# Patient Record
Sex: Female | Born: 1980 | Race: White | Hispanic: No | Marital: Single | Smoking: Former smoker
Health system: Southern US, Community
[De-identification: ages and names within clinical notes are randomized; demographics above are authoritative.]

## PROBLEM LIST (undated history)

## (undated) DIAGNOSIS — F419 Anxiety disorder, unspecified: Secondary | ICD-10-CM

## (undated) DIAGNOSIS — J45990 Exercise induced bronchospasm: Secondary | ICD-10-CM

## (undated) DIAGNOSIS — G479 Sleep disorder, unspecified: Secondary | ICD-10-CM

## (undated) DIAGNOSIS — J45909 Unspecified asthma, uncomplicated: Secondary | ICD-10-CM

## (undated) DIAGNOSIS — F329 Major depressive disorder, single episode, unspecified: Secondary | ICD-10-CM

## (undated) DIAGNOSIS — F32A Depression, unspecified: Secondary | ICD-10-CM

## (undated) DIAGNOSIS — G43909 Migraine, unspecified, not intractable, without status migrainosus: Secondary | ICD-10-CM

## (undated) DIAGNOSIS — K219 Gastro-esophageal reflux disease without esophagitis: Secondary | ICD-10-CM

## (undated) DIAGNOSIS — K2970 Gastritis, unspecified, without bleeding: Secondary | ICD-10-CM

## (undated) HISTORY — PX: OTHER SURGICAL HISTORY: SHX169

## (undated) HISTORY — DX: Sleep disorder, unspecified: G47.9

## (undated) HISTORY — DX: Depression, unspecified: F32.A

## (undated) HISTORY — DX: Migraine, unspecified, not intractable, without status migrainosus: G43.909

## (undated) HISTORY — DX: Exercise induced bronchospasm: J45.990

## (undated) HISTORY — DX: Gastritis, unspecified, without bleeding: K29.70

## (undated) HISTORY — DX: Major depressive disorder, single episode, unspecified: F32.9

## (undated) HISTORY — DX: Anxiety disorder, unspecified: F41.9

---
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
## 2010-11-27 ENCOUNTER — Ambulatory Visit: Payer: Self-pay | Admitting: General Practice

## 2012-01-05 ENCOUNTER — Emergency Department: Payer: Self-pay | Admitting: Emergency Medicine

## 2012-01-05 LAB — URINALYSIS, COMPLETE
Ketone: NEGATIVE
Nitrite: NEGATIVE
Protein: NEGATIVE
Specific Gravity: 1.023 (ref 1.003–1.030)

## 2013-02-24 ENCOUNTER — Ambulatory Visit: Payer: Self-pay | Admitting: Obstetrics and Gynecology

## 2013-03-22 ENCOUNTER — Ambulatory Visit: Payer: Self-pay | Admitting: Obstetrics and Gynecology

## 2013-04-15 ENCOUNTER — Inpatient Hospital Stay: Payer: Self-pay

## 2013-04-15 LAB — CBC WITH DIFFERENTIAL/PLATELET
BASOS ABS: 0 10*3/uL (ref 0.0–0.1)
BASOS PCT: 0.4 %
EOS ABS: 0.1 10*3/uL (ref 0.0–0.7)
Eosinophil %: 0.7 %
HCT: 30.6 % — AB (ref 35.0–47.0)
HGB: 9.9 g/dL — AB (ref 12.0–16.0)
Lymphocyte #: 1.2 10*3/uL (ref 1.0–3.6)
Lymphocyte %: 15.6 %
MCH: 26.4 pg (ref 26.0–34.0)
MCHC: 32.4 g/dL (ref 32.0–36.0)
MCV: 82 fL (ref 80–100)
Monocyte #: 0.6 x10 3/mm (ref 0.2–0.9)
Monocyte %: 7.2 %
NEUTROS ABS: 5.9 10*3/uL (ref 1.4–6.5)
Neutrophil %: 76.1 %
PLATELETS: 134 10*3/uL — AB (ref 150–440)
RBC: 3.75 10*6/uL — ABNORMAL LOW (ref 3.80–5.20)
RDW: 15.4 % — AB (ref 11.5–14.5)
WBC: 7.8 10*3/uL (ref 3.6–11.0)

## 2013-04-16 LAB — PLATELET COUNT: Platelet: 131 10*3/uL — ABNORMAL LOW (ref 150–440)

## 2013-04-17 LAB — HEMATOCRIT: HCT: 27 % — ABNORMAL LOW (ref 35.0–47.0)

## 2013-04-22 ENCOUNTER — Ambulatory Visit: Payer: Self-pay | Admitting: Obstetrics and Gynecology

## 2013-07-26 IMAGING — CR DG ABDOMEN 1V
1 series · 2 of 2 positions shown · non-contrast
Comparison: none

REASON FOR EXAM: abd pain, decrease BS, no BM
COMMENTS:   May transport without cardiac monitor

PROCEDURE:     DXR - DXR KIDNEY URETER BLADDER  - January 05, 2012  [DATE]
RESULT:     Bowel gas pattern is unremarkable. Air and fecal material is
scattered through the colon to the rectum. There is no obstruction. There is
no mass effect. Phleboliths are present in the right pelvis.

[Series 1: supine kub · 0.17mm/px · 2 of 2 slices shown]
[im 1/2]
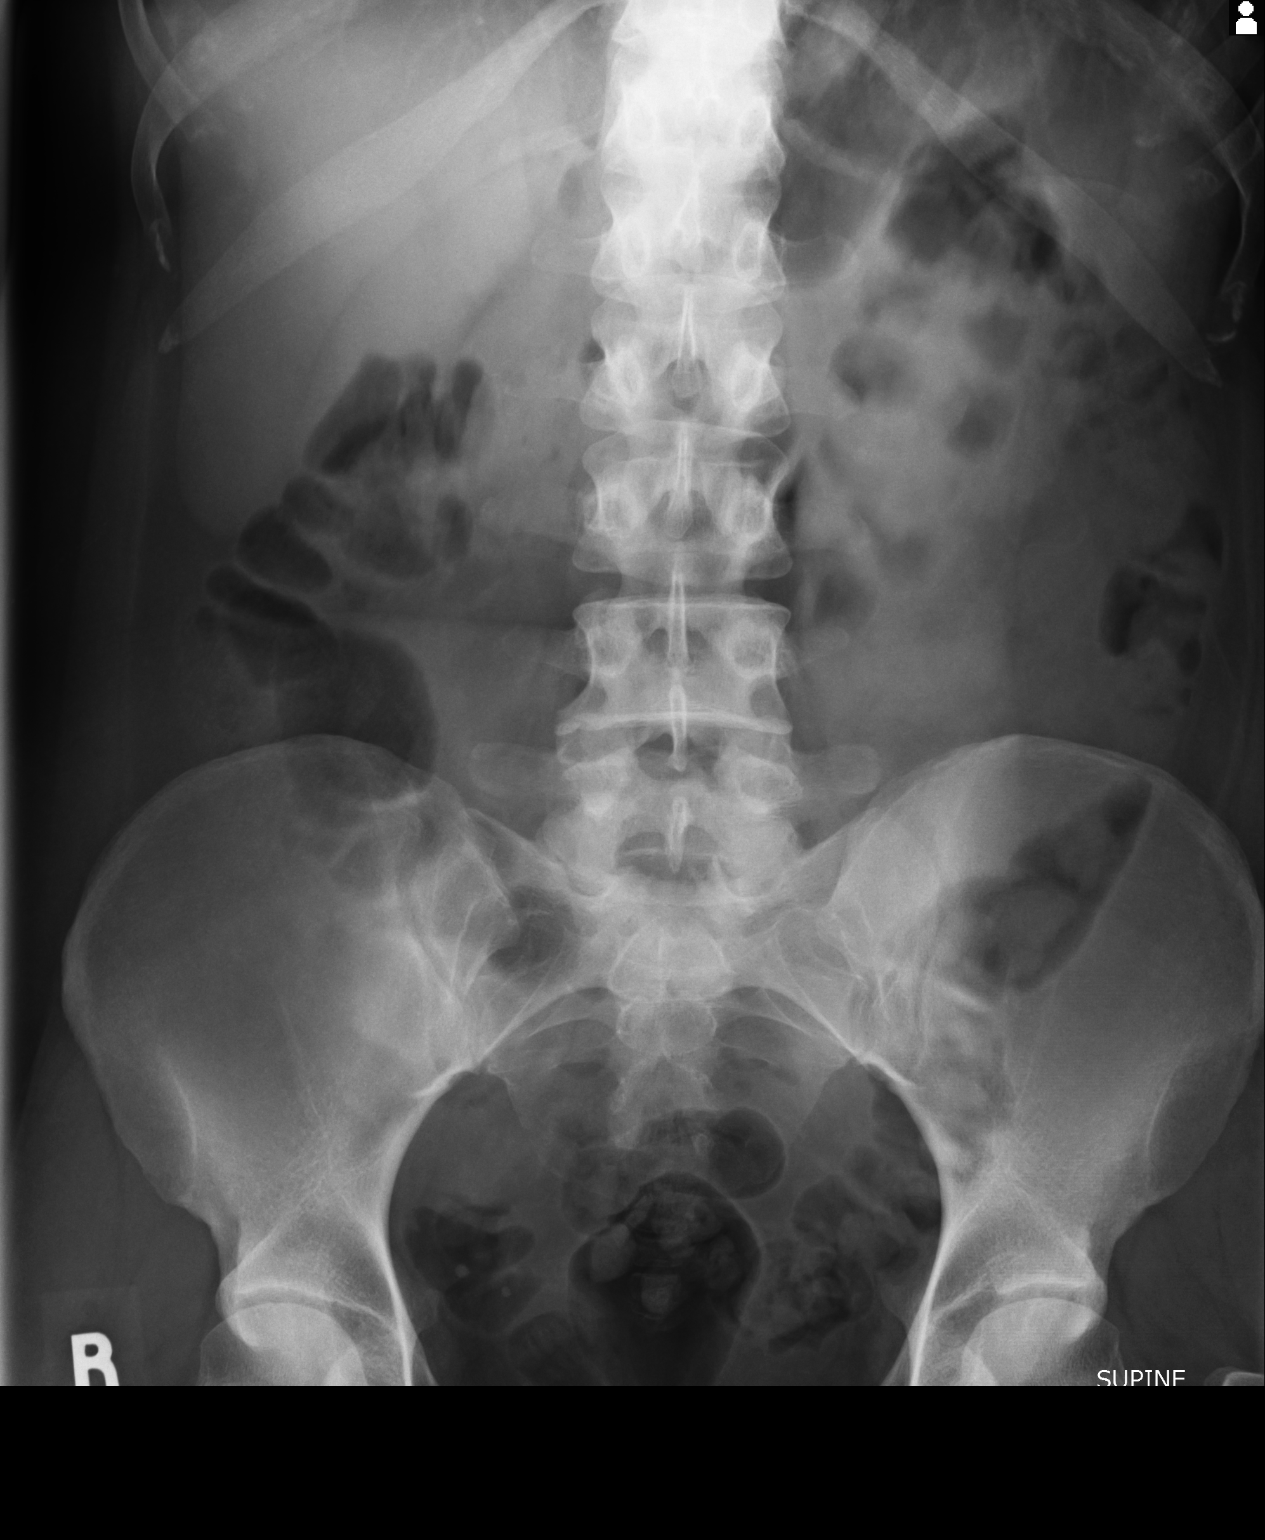
[im 2/2]
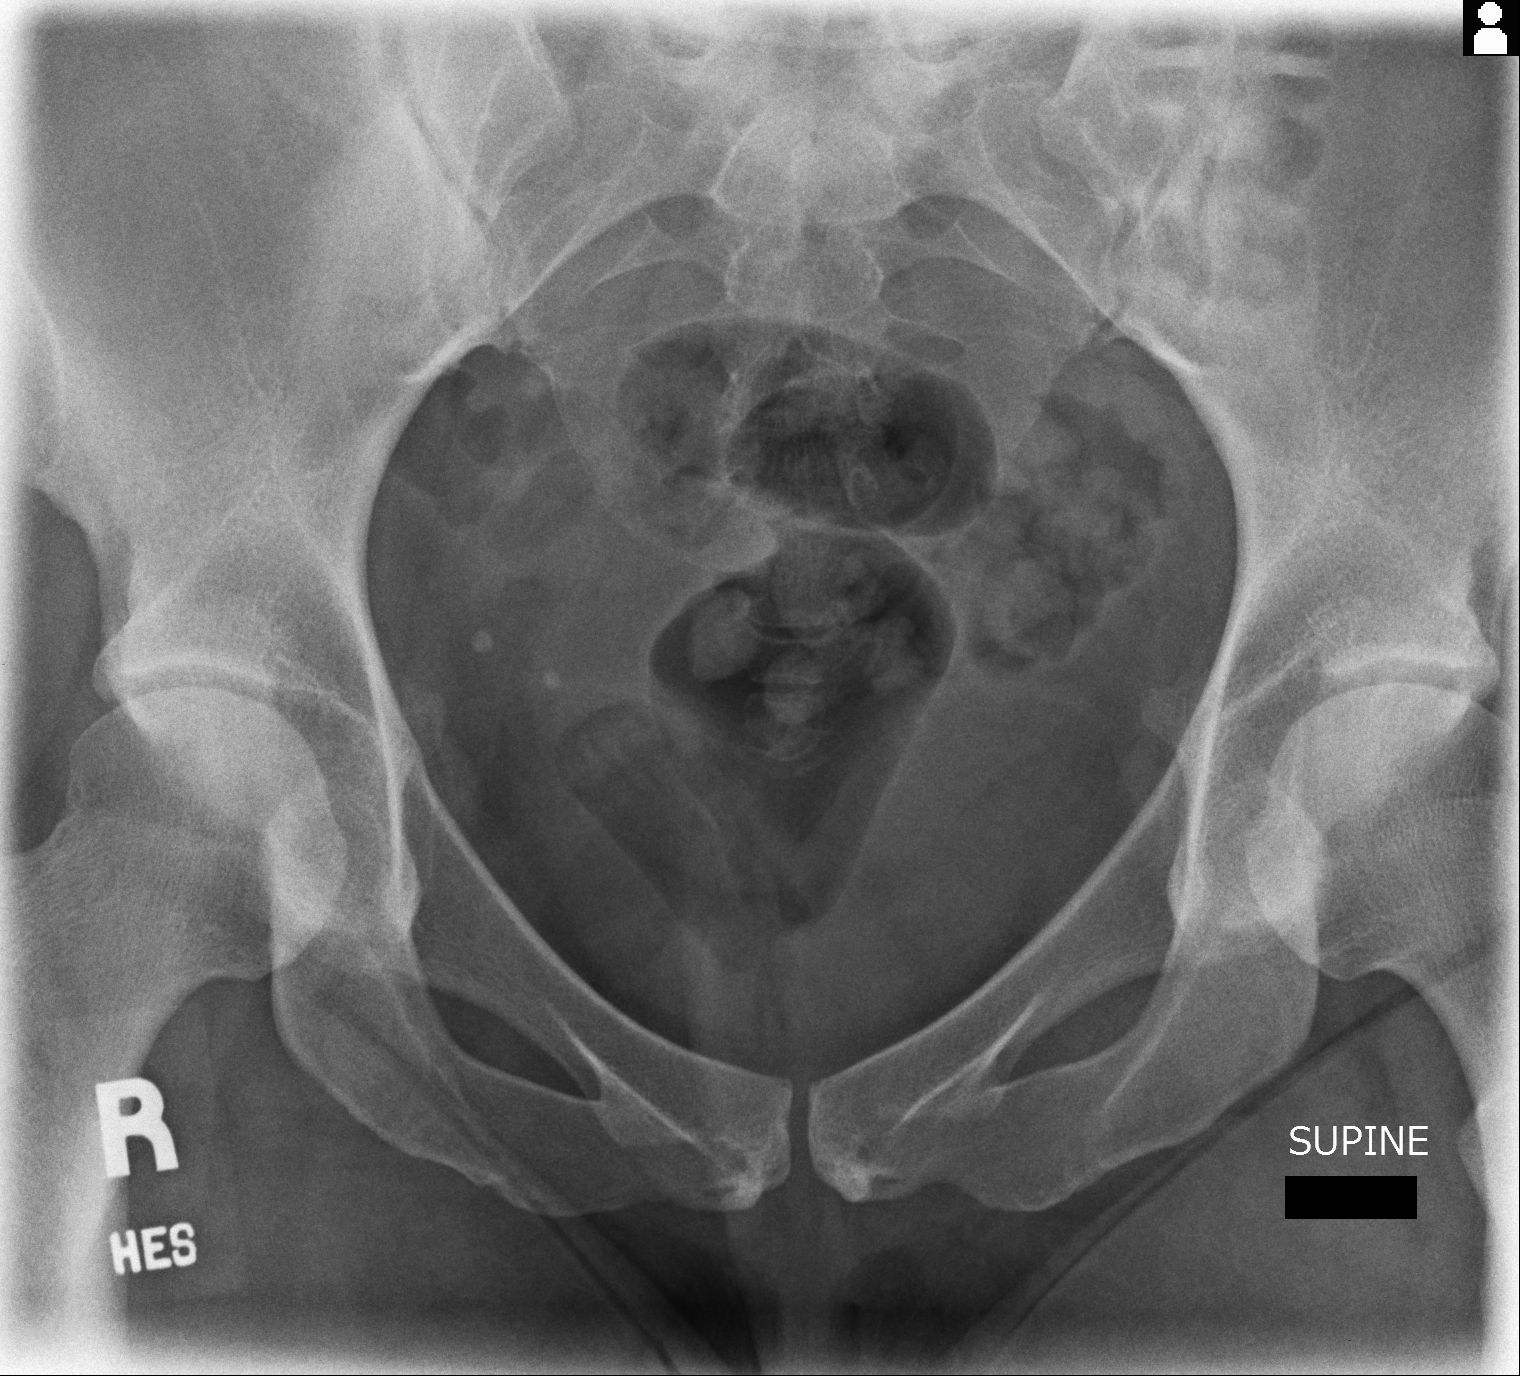

[2 of 2 positions shown; findings below may reference images not displayed]

IMPRESSION: No acute abnormality evident.

[REDACTED]

## 2014-04-01 ENCOUNTER — Ambulatory Visit (INDEPENDENT_AMBULATORY_CARE_PROVIDER_SITE_OTHER): Payer: BLUE CROSS/BLUE SHIELD | Admitting: General Surgery

## 2014-04-01 ENCOUNTER — Encounter: Payer: Self-pay | Admitting: General Surgery

## 2014-04-01 ENCOUNTER — Encounter: Payer: Self-pay | Admitting: *Deleted

## 2014-04-01 VITALS — BP 116/62 | HR 74 | Temp 97.9°F | Resp 12 | Ht 70.0 in | Wt 245.0 lb

## 2014-04-01 DIAGNOSIS — K603 Anal fistula: Secondary | ICD-10-CM | POA: Diagnosis not present

## 2014-04-01 DIAGNOSIS — K6289 Other specified diseases of anus and rectum: Secondary | ICD-10-CM | POA: Diagnosis not present

## 2014-04-01 LAB — POC HEMOCCULT BLD/STL (OFFICE/1-CARD/DIAGNOSTIC): Fecal Occult Blood, POC: NEGATIVE

## 2014-04-01 NOTE — Patient Instructions (Signed)
Patient to return ion two weeks.

## 2014-04-01 NOTE — Progress Notes (Signed)
[name], female   DOB: 07/11/1980, 34 y.o.   MRN: [id number]  Chief Complaint  Patient presents with  . Other    rectal abscess    HPI Veronica Reynolds is a 34 y.o. female here today for a evaluation of a rectal abscess. She states she noticed this about a week ago.The area is draining blood and she has noticed some clots of blood.  She is currently take Bactrim.  She states her fever has been around 100. She states she had a rectal abscess that was drained back in October 2015. HPI  Past Medical History  Diagnosis Date  . Depression     No past surgical history on file.  No family history on file.  Social History History  Substance Use Topics  . Smoking status: Former Smoker -- 1.00 packs/day    Types: Cigarettes  . Smokeless tobacco: Not on file  . Alcohol Use: No    Allergies  Allergen Reactions  . Codeine Nausea And Vomiting    Current Outpatient Prescriptions  Medication Sig Dispense Refill  . clonazePAM (KLONOPIN) 1 MG tablet Take 1 tablet by mouth as needed.    . sulfamethoxazole-trimethoprim (BACTRIM,SEPTRA) 400-80 MG per tablet Take 1 tablet by mouth 2 (two) times daily.  0   No current facility-administered medications for this visit.    Review of Systems Review of Systems  Constitutional: Positive for fever. Negative for chills, diaphoresis, activity change, appetite change, fatigue and unexpected weight change.  Respiratory: Negative.   Cardiovascular: Negative.     Blood pressure 116/62, pulse 74, temperature 97.9 F (36.6 C), resp. rate 12, height 5\' 10"  (1.778 m), weight 245 lb (111.131 kg).  Physical Exam Physical Exam  Constitutional: She is oriented to person, place, and time. She appears well-developed and well-nourished.  Eyes: Conjunctivae are normal. No scleral icterus.  Neck: Neck supple.  Cardiovascular: Normal rate and normal heart sounds.   Pulmonary/Chest: Effort normal and breath sounds normal.  Abdominal: Soft.  Bowel sounds are normal. There is no tenderness.  Genitourinary: Rectal exam shows anal tone normal. Guaiac negative stool.     Examination was completed in both stirrups and in the left lateral decubitus position. No abnormality of the vaginal mucosa was appreciated. The perineum was intact. No erythema, induration or tenderness.  Lymphadenopathy:    She has no cervical adenopathy.  Neurological: She is alert and oriented to person, place, and time.  Skin: Skin is dry.    Data Reviewed Office notes from [place], [place] dated 10/31/2013 were reviewed. This was the follow-up status post an earlier exam for an abscess. Normal anal rectal/vaginal exam reported.  Record of more recent exam not available at time of visit.  Anoscopy was completed: Normal visualized lower rectal mucosa. No internal or external hemorrhoids. Suspected small anterior fistula opening.  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    Plan    At this time, I think the likelihood of benefit from operative exploration is small. The patient will complete the prescribed course of Bactrim, SS, 1 by mouth twice a day over the next week. We'll plan for reassessment after that therapy is completed. We'll try to obtain the records from her earlier exams prior to abscess drainage and her more recent exam prompting today's referral.   Patient to return in two weeks     Ref. Melody Trudee Kuster  Robert Bellow 04/01/2014, 12:48 PM

## 2014-04-05 ENCOUNTER — Ambulatory Visit: Payer: Self-pay | Admitting: General Surgery

## 2014-04-07 ENCOUNTER — Emergency Department: Payer: Self-pay | Admitting: Internal Medicine

## 2014-04-12 DIAGNOSIS — K6289 Other specified diseases of anus and rectum: Secondary | ICD-10-CM | POA: Insufficient documentation

## 2014-04-15 ENCOUNTER — Ambulatory Visit: Payer: BLUE CROSS/BLUE SHIELD | Admitting: General Surgery

## 2014-06-01 NOTE — H&P (Signed)
L&D Evaluation:  History:  Presents with IOL at [redacted]w[redacted]d with GDM   Patient's Medical History other  anxiety disorder-   Patient's Surgical History none   Medications Pre Natal Vitamins  Tylenol (Acetaminophen)  Diclegis,zoloft(stopped x 4 weeks),   Allergies n/v with codiene   Social History none   Family History Non-Contributory   ROS:  ROS All systems were reviewed.  HEENT, CNS, GI, GU, Respiratory, CV, Renal and Musculoskeletal systems were found to be normal.   Exam:  Vital Signs stable   General no apparent distress   Mental Status clear  anxious   Chest clear   Heart normal sinus rhythm   Abdomen gravid, tender with contractions   Estimated Fetal Weight Average for gestational age   Fetal Position vtx   Edema 1+   Pelvic ft   Mebranes Intact   FHT normal rate with no decels   Fetal Heart Rate 135   Ucx regular, pitocin at 1 mu/min   Ucx Frequency 3 min   Length of each Contraction 60 seconds   Ucx Pain Scale 7   Skin dry   Impression:  Impression IOL   Plan:  Plan EFM/NST, monitor contractions and for cervical change   Electronic Signatures: Gennie Alma N (CNM)  (Signed 26-Mar-15 07:58)  Authored: L&D Evaluation   Last Updated: 26-Mar-15 07:58 by Evonnie Pat (CNM)

## 2014-08-04 ENCOUNTER — Other Ambulatory Visit: Payer: Self-pay | Admitting: Obstetrics and Gynecology

## 2014-08-04 DIAGNOSIS — K611 Rectal abscess: Secondary | ICD-10-CM

## 2014-09-24 ENCOUNTER — Encounter: Payer: Self-pay | Admitting: Obstetrics and Gynecology

## 2014-10-01 ENCOUNTER — Other Ambulatory Visit: Payer: Self-pay | Admitting: Obstetrics and Gynecology

## 2014-10-07 ENCOUNTER — Encounter: Payer: Self-pay | Admitting: *Deleted

## 2014-10-08 ENCOUNTER — Encounter: Payer: Self-pay | Admitting: Obstetrics and Gynecology

## 2014-10-08 ENCOUNTER — Ambulatory Visit (INDEPENDENT_AMBULATORY_CARE_PROVIDER_SITE_OTHER): Payer: BLUE CROSS/BLUE SHIELD | Admitting: Obstetrics and Gynecology

## 2014-10-08 VITALS — BP 120/72 | HR 81 | Ht 70.0 in | Wt 240.6 lb

## 2014-10-08 DIAGNOSIS — K625 Hemorrhage of anus and rectum: Secondary | ICD-10-CM

## 2014-10-08 DIAGNOSIS — G43809 Other migraine, not intractable, without status migrainosus: Secondary | ICD-10-CM | POA: Diagnosis not present

## 2014-10-08 DIAGNOSIS — Z01419 Encounter for gynecological examination (general) (routine) without abnormal findings: Secondary | ICD-10-CM | POA: Diagnosis not present

## 2014-10-08 NOTE — Patient Instructions (Signed)
Thank you for enrolling in [facility]. Please follow the instructions below to securely access your online medical record. MyChart allows you to send messages to your doctor, view your test results, renew your prescriptions, schedule appointments, and more.  How Do I Sign Up? 1. In your Internet browser, go to [email or web address]. 2. Click on the New  User? link in the Sign In box.  3. Enter your MyChart Access Code exactly as it appears below. You will not need to use this code after you have completed the sign-up process. If you do not sign up before the expiration date, you must request a new code. MyChart Access Code: [email or web address] Expires: 12/07/2014 11:05 AM  4. Enter the last four digits of your Social Security Number (xxxx) and Date of Birth (mm/dd/yyyy) as indicated and click Next. You will be taken to the next sign-up page. 5. Create a MyChart ID. This will be your MyChart login ID and cannot be changed, so think of one that is secure and easy to remember. 6. Create a MyChart password. You can change your password at any time. 7. Enter your Password Reset Question and Answer and click Next. This can be used at a later time if you forget your password.  8. Select your communication preference, and if applicable enter your e-mail address. You will receive e-mail notification when new information is available in MyChart by choosing to receive e-mail notifications and filling in your e-mail. 9. Click Sign In. You can now view your medical record.   Additional Information If you have questions, you can email [email or web address] or call [phone] to talk to our [facility] staff. Remember, MyChart is NOT to be used for urgent needs. For medical emergencies, dial 911.

## 2014-10-09 LAB — COMPREHENSIVE METABOLIC PANEL
ALBUMIN: 4.2 g/dL (ref 3.5–5.5)
ALK PHOS: 72 IU/L (ref 39–117)
ALT: 13 IU/L (ref 0–32)
AST: 12 IU/L (ref 0–40)
Albumin/Globulin Ratio: 1.5 (ref 1.1–2.5)
BILIRUBIN TOTAL: 0.5 mg/dL (ref 0.0–1.2)
BUN / CREAT RATIO: 16 (ref 8–20)
BUN: 14 mg/dL (ref 6–20)
CHLORIDE: 103 mmol/L (ref 97–108)
CO2: 19 mmol/L (ref 18–29)
Calcium: 8.8 mg/dL (ref 8.7–10.2)
Creatinine, Ser: 0.88 mg/dL (ref 0.57–1.00)
GFR calc Af Amer: 100 mL/min/{1.73_m2} (ref >59)
GFR calc non Af Amer: 87 mL/min/{1.73_m2} (ref >59)
GLUCOSE: 84 mg/dL (ref 65–99)
Globulin, Total: 2.8 g/dL (ref 1.5–4.5)
Potassium: 4.2 mmol/L (ref 3.5–5.2)
SODIUM: 140 mmol/L (ref 134–144)
Total Protein: 7 g/dL (ref 6.0–8.5)

## 2014-10-09 LAB — CBC
HEMATOCRIT: 40.1 % (ref 34.0–46.6)
Hemoglobin: 13.5 g/dL (ref 11.1–15.9)
MCH: 28.7 pg (ref 26.6–33.0)
MCHC: 33.7 g/dL (ref 31.5–35.7)
MCV: 85 fL (ref 79–97)
PLATELETS: 211 10*3/uL (ref 150–379)
RBC: 4.7 x10E6/uL (ref 3.77–5.28)
RDW: 14.4 % (ref 12.3–15.4)
WBC: 6 10*3/uL (ref 3.4–10.8)

## 2014-10-09 LAB — THYROID PANEL WITH TSH
Free Thyroxine Index: 2.2 (ref 1.2–4.9)
T3 Uptake Ratio: 30 % (ref 24–39)
T4 TOTAL: 7.4 ug/dL (ref 4.5–12.0)
TSH: 2.62 u[IU]/mL (ref 0.450–4.500)

## 2014-10-09 LAB — HEMOGLOBIN A1C
Est. average glucose Bld gHb Est-mCnc: 111 mg/dL
HEMOGLOBIN A1C: 5.5 % (ref 4.8–5.6)

## 2014-10-09 LAB — VITAMIN B12: VITAMIN B 12: 253 pg/mL (ref 211–946)

## 2014-10-09 LAB — MAGNESIUM: Magnesium: 2.4 mg/dL — ABNORMAL HIGH (ref 1.6–2.3)

## 2014-10-09 LAB — VITAMIN D 25 HYDROXY (VIT D DEFICIENCY, FRACTURES): Vit D, 25-Hydroxy: 29.3 ng/mL — ABNORMAL LOW (ref 30.0–100.0)

## 2014-10-09 LAB — FERRITIN: Ferritin: 37 ng/mL (ref 15–150)

## 2014-10-11 ENCOUNTER — Telehealth: Payer: Self-pay | Admitting: *Deleted

## 2014-10-11 NOTE — Progress Notes (Signed)
Subjective:   Veronica Reynolds is a 34 y.o. G20P0001 Caucasian female here for a routine well-woman exam.  No LMP recorded. Patient is not currently having periods (Reason: Oral contraceptives).    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  Past Medical History Past Medical History  Diagnosis Date  . Depression   . Sleep disturbance   . Anxiety     Past Surgical History History reviewed. No pertinent past surgical history.  Gynecologic History G1P0001  No LMP recorded. Patient is not currently having periods (Reason: Oral contraceptives). Contraception: abstinence Last Pap: 2015. Results were: normal   Obstetric History OB History  Gravida Para Term Preterm AB SAB TAB Ectopic Multiple Living  1 1 0 0 0 0 0 0 0 1     # Outcome Date GA Lbr Len/2nd Weight Sex Delivery Anes PTL Lv  1 Para             Obstetric Comments  1st Menstrual Cycle: 10    1st Pregnancy:  20    Current Medications Current Outpatient Prescriptions on File Prior to Visit  Medication Sig Dispense Refill  . NORA-BE 0.35 MG tablet TAKE 1 TABLET BY MOUTH EVERY DAY 28 tablet 2  . sulfamethoxazole-trimethoprim (BACTRIM,SEPTRA) 400-80 MG per tablet Take 1 tablet by mouth 2 (two) times daily.  0   No current  facility-administered medications on file prior to visit.    Review of Systems Patient denies any headaches, blurred vision, shortness of breath, chest pain, abdominal pain, problems with bowel movements, urination, or intercourse.  Objective:  BP 120/72 mmHg  Pulse 81  Ht 5\' 10"  (1.778 m)  Wt 240 lb 9.6 oz (109.135 kg)  BMI 34.52 kg/m2 Physical Exam  General:  Well developed, well nourished, no acute distress. She is alert and oriented x3. Skin:  Warm and dry Neck:  Midline trachea, no thyromegaly or nodules Cardiovascular: Regular rate and rhythm, no murmur heard Lungs:  Effort normal, all lung fields clear to auscultation bilaterally Breasts:  No dominant palpable mass, retraction, or nipple discharge Abdomen:  Soft, non tender, no hepatosplenomegaly or masses Pelvic:  External genitalia is normal in appearance.  The vagina is normal in appearance. The cervix is bulbous, no CMT.  Thin prep pap is done with HR HPV cotesting. Uterus is felt to be normal size, shape, and contour.  No adnexal masses or tenderness noted. Rectal: Good sphincter tone, no polyps, or hemorrhoids felt.  Hemoccult: NA Extremities:  No swelling or varicosities noted Psych:  She has a normal mood and affect  Assessment:   Healthy well-woman exam; Obesity; rectal abcess- in remission; fatty tissue tumors  Plan:  Pap only obtained Counseled on weight loss Referral to general surgeon F/U 1 year  for AE, or sooner if needed   Melody Valene Bors, CNM

## 2014-10-11 NOTE — Telephone Encounter (Signed)
-----   Message from Evonnie Pat, [place] sent at 10/11/2014  8:21 AM EDT ----- Please let her know all labs are normal except vit D is slightly low, recommend OTC 5000 IU vit D3 daily

## 2014-10-11 NOTE — Telephone Encounter (Signed)
Notified pt of lab results, she voiced understanding about OTC Vit D3

## 2014-11-24 ENCOUNTER — Other Ambulatory Visit: Payer: Self-pay

## 2014-11-25 ENCOUNTER — Encounter (INDEPENDENT_AMBULATORY_CARE_PROVIDER_SITE_OTHER): Payer: Self-pay

## 2014-11-25 ENCOUNTER — Ambulatory Visit (INDEPENDENT_AMBULATORY_CARE_PROVIDER_SITE_OTHER): Payer: BLUE CROSS/BLUE SHIELD | Admitting: Gastroenterology

## 2014-11-25 ENCOUNTER — Encounter: Payer: Self-pay | Admitting: Gastroenterology

## 2014-11-25 VITALS — BP 127/69 | HR 91 | Temp 98.1°F | Ht 70.0 in | Wt 244.0 lb

## 2014-11-25 DIAGNOSIS — R200 Anesthesia of skin: Secondary | ICD-10-CM | POA: Insufficient documentation

## 2014-11-25 DIAGNOSIS — R42 Dizziness and giddiness: Secondary | ICD-10-CM | POA: Insufficient documentation

## 2014-11-25 DIAGNOSIS — R110 Nausea: Secondary | ICD-10-CM

## 2014-11-25 DIAGNOSIS — G43109 Migraine with aura, not intractable, without status migrainosus: Secondary | ICD-10-CM | POA: Insufficient documentation

## 2014-11-25 DIAGNOSIS — K920 Hematemesis: Secondary | ICD-10-CM

## 2014-11-25 DIAGNOSIS — K921 Melena: Secondary | ICD-10-CM | POA: Diagnosis not present

## 2014-11-25 NOTE — Progress Notes (Signed)
Gastroenterology Consultation  Referring Provider:     No ref. provider found Primary Care Physician:  No PCP Per Patient Primary Gastroenterologist:  Dr. Allen Norris     Reason for Consultation:     Rectal bleeding        HPI:   Veronica Reynolds is a 34 y.o. y/o female referred for consultation & management of the bleeding by Dr. Rayne Du PCP Per Patient.  This patient comes today with reports of GI symptoms and rectal bleeding since the birth of her child 18 months ago. The patient states that she started having fevers and chills with body aches and was found to have a perirectal abscess from an anal fissure. The patient had a trained and then shortly after that the symptoms came back. The patient reports that she went to [facility] and had a trained again. She continues to have similar symptoms with resulting nausea and vomiting and she states she has vomited blood multiple times. She also reports that she has rectal bleeding almost every day. There is no report of any unexplained weight loss, black stools or abdominal pain. The patient is concerned that something must be going on because of her rectal bleeding and chronic drainage. Patient denies any family history of colon cancer except a elderly grandfather and she also denies any family history of Crohn's or ulcerative colitis.  Past Medical History  Diagnosis Date  . Depression   . Sleep disturbance   . Anxiety   . Migraine     Past Surgical History  Procedure Laterality Date  . Anal fissue repair      Prior to Admission medications   Medication Sig Start Date End Date Taking? Authorizing Provider  clonazePAM (KLONOPIN) 2 MG tablet Take 2 mg by mouth daily.   Yes Historical Provider, MD  NORA-BE 0.35 MG tablet TAKE 1 TABLET BY MOUTH EVERY DAY 10/01/14  Yes Melody Valene Bors, CNM  nortriptyline (PAMELOR) 10 MG capsule Take 1 capsule at bedtime for 1 week, then increase to 2 capsules at bedtime for 1 week, then increase to 3  capsules at bedtime and continue 11/25/14  Yes Historical Provider, MD  lamoTRIgine (LAMICTAL) 100 MG tablet  01/11/14   Historical Provider, MD  ondansetron (ZOFRAN) 8 MG tablet  02/19/14   Historical Provider, MD  sertraline (ZOLOFT) 100 MG tablet  01/11/14   Historical Provider, MD  sulfamethoxazole-trimethoprim (BACTRIM,SEPTRA) 400-80 MG per tablet Take 1 tablet by mouth 2 (two) times daily. 03/30/14   Historical Provider, MD    Family History  Problem Relation Age of Onset  . Atrial fibrillation Mother   . Alcohol abuse Father   . Arthritis Maternal Grandmother      Social History  Substance Use Topics  . Smoking status: Former Smoker -- 1.00 packs/day    Types: Cigarettes  . Smokeless tobacco: Never Used  . Alcohol Use: No    Allergies as of 11/25/2014 - Review Complete 10/08/2014  Allergen Reaction Noted  . Codeine Nausea And Vomiting 04/01/2014    Review of Systems:    All systems reviewed and negative except where noted in HPI.   Physical Exam:  BP 127/69 mmHg  Pulse 91  Temp(Src) 98.1 F (36.7 C) (Oral)  Ht 5\' 10"  (1.778 m)  Wt 244 lb (110.678 kg)  BMI 35.01 kg/m2 No LMP recorded. Patient is not currently having periods (Reason: Oral contraceptives). Psych:  Alert and cooperative. Normal mood and affect. General:   Alert,  Well-developed,  well-nourished, pleasant and cooperative in NAD Head:  Normocephalic and atraumatic. Eyes:  Sclera clear, no icterus.   Conjunctiva pink. Ears:  Normal auditory acuity. Nose:  No deformity, discharge, or lesions. Mouth:  No deformity or lesions,oropharynx pink & moist. Neck:  Supple; no masses or thyromegaly. Lungs:  Respirations even and unlabored.  Clear throughout to auscultation.   No wheezes, crackles, or rhonchi. No acute distress. Heart:  Regular rate and rhythm; no murmurs, clicks, rubs, or gallops. Abdomen:  Normal bowel sounds.  No bruits.  Soft, non-tender and non-distended without masses, hepatosplenomegaly or  hernias noted.  No guarding or rebound tenderness.  Negative Carnett sign.   Rectal:  Deferred.  Msk:  Symmetrical without gross deformities.  Good, equal movement & strength bilaterally. Pulses:  Normal pulses noted. Extremities:  No clubbing or edema.  No cyanosis. Neurologic:  Alert and oriented x3;  grossly normal neurologically. Skin:  Intact without significant lesions or rashes.  No jaundice. Lymph Nodes:  No significant cervical adenopathy. Psych:  Alert and cooperative. Normal mood and affect.  Imaging Studies: No results found.  Assessment and Plan:   Veronica Reynolds is a 34 y.o. y/o female who has a history of rectal bleeding and a perirectal abscess with drainage at least twice due to what she reports to be a fissure. The patient also has frequent diarrhea with nausea and vomiting. The patient has vomited blood and continues to have rectal bleeding. The patient will be set up for an EGD and colonoscopy.I have discussed risks & benefits which include, but are not limited to, bleeding, infection, perforation & drug reaction.  The patient agrees with this plan & written consent will be obtained.      Note: This dictation was prepared with Dragon dictation along with smaller phrase technology. Any transcriptional errors that result from this process are unintentional.

## 2014-11-26 ENCOUNTER — Other Ambulatory Visit: Payer: Self-pay

## 2014-11-26 DIAGNOSIS — G43109 Migraine with aura, not intractable, without status migrainosus: Secondary | ICD-10-CM | POA: Insufficient documentation

## 2014-12-06 ENCOUNTER — Encounter: Payer: Self-pay | Admitting: *Deleted

## 2014-12-09 NOTE — Discharge Instructions (Signed)

## 2014-12-10 ENCOUNTER — Ambulatory Visit: Payer: BLUE CROSS/BLUE SHIELD | Admitting: Anesthesiology

## 2014-12-10 ENCOUNTER — Encounter
Admission: RE | Disposition: A | Discharge status: Home / Self Care | Payer: Self-pay | Source: Ambulatory Visit | Attending: Gastroenterology

## 2014-12-10 ENCOUNTER — Ambulatory Visit
Admission: RE | Admit: 2014-12-10 | Discharge: 2014-12-10 | Disposition: A | Discharge status: Home / Self Care | Payer: BLUE CROSS/BLUE SHIELD | Source: Ambulatory Visit | Attending: Gastroenterology | Admitting: Gastroenterology

## 2014-12-10 ENCOUNTER — Other Ambulatory Visit: Payer: Self-pay | Admitting: Gastroenterology

## 2014-12-10 DIAGNOSIS — R110 Nausea: Secondary | ICD-10-CM | POA: Diagnosis not present

## 2014-12-10 DIAGNOSIS — Z8261 Family history of arthritis: Secondary | ICD-10-CM | POA: Diagnosis not present

## 2014-12-10 DIAGNOSIS — Z885 Allergy status to narcotic agent status: Secondary | ICD-10-CM | POA: Diagnosis not present

## 2014-12-10 DIAGNOSIS — Z79899 Other long term (current) drug therapy: Secondary | ICD-10-CM | POA: Diagnosis not present

## 2014-12-10 DIAGNOSIS — K641 Second degree hemorrhoids: Secondary | ICD-10-CM | POA: Diagnosis not present

## 2014-12-10 DIAGNOSIS — F329 Major depressive disorder, single episode, unspecified: Secondary | ICD-10-CM | POA: Insufficient documentation

## 2014-12-10 DIAGNOSIS — Z8249 Family history of ischemic heart disease and other diseases of the circulatory system: Secondary | ICD-10-CM | POA: Insufficient documentation

## 2014-12-10 DIAGNOSIS — K2950 Unspecified chronic gastritis without bleeding: Secondary | ICD-10-CM | POA: Insufficient documentation

## 2014-12-10 DIAGNOSIS — G479 Sleep disorder, unspecified: Secondary | ICD-10-CM | POA: Diagnosis not present

## 2014-12-10 DIAGNOSIS — Z87891 Personal history of nicotine dependence: Secondary | ICD-10-CM | POA: Diagnosis not present

## 2014-12-10 DIAGNOSIS — Z811 Family history of alcohol abuse and dependence: Secondary | ICD-10-CM | POA: Diagnosis not present

## 2014-12-10 DIAGNOSIS — F419 Anxiety disorder, unspecified: Secondary | ICD-10-CM | POA: Diagnosis not present

## 2014-12-10 DIAGNOSIS — K921 Melena: Secondary | ICD-10-CM | POA: Diagnosis not present

## 2014-12-10 DIAGNOSIS — G43909 Migraine, unspecified, not intractable, without status migrainosus: Secondary | ICD-10-CM | POA: Diagnosis not present

## 2014-12-10 HISTORY — DX: Unspecified asthma, uncomplicated: J45.909

## 2014-12-10 HISTORY — PX: ESOPHAGOGASTRODUODENOSCOPY (EGD) WITH PROPOFOL: SHX5813

## 2014-12-10 HISTORY — PX: COLONOSCOPY WITH PROPOFOL: SHX5780

## 2014-12-10 SURGERY — COLONOSCOPY WITH PROPOFOL
Anesthesia: Monitor Anesthesia Care | Wound class: Contaminated

## 2014-12-10 MED ORDER — LIDOCAINE HCL (CARDIAC) 20 MG/ML IV SOLN
INTRAVENOUS | Status: DC | PRN
  Administered 2014-12-10: 40 mg via INTRAVENOUS

## 2014-12-10 MED ORDER — PROPOFOL 10 MG/ML IV BOLUS
INTRAVENOUS | Status: DC | PRN
Start: 2014-12-10 — End: 2014-12-10
  Administered 2014-12-10: 50 mg via INTRAVENOUS
  Administered 2014-12-10: 20 mg via INTRAVENOUS
  Administered 2014-12-10 (×2): 30 mg via INTRAVENOUS
  Administered 2014-12-10: 50 mg via INTRAVENOUS
  Administered 2014-12-10: 100 mg via INTRAVENOUS
  Administered 2014-12-10 (×2): 50 mg via INTRAVENOUS
  Administered 2014-12-10: 20 mg via INTRAVENOUS

## 2014-12-10 MED ORDER — STERILE WATER FOR IRRIGATION IR SOLN
Status: DC | PRN
  Administered 2014-12-10: 11:00:00

## 2014-12-10 MED ORDER — LACTATED RINGERS IV SOLN
INTRAVENOUS | Status: DC
  Administered 2014-12-10: 10:00:00 via INTRAVENOUS

## 2014-12-10 MED ORDER — GLYCOPYRROLATE 0.2 MG/ML IJ SOLN
INTRAMUSCULAR | Status: DC | PRN
Start: 2014-12-10 — End: 2014-12-10
  Administered 2014-12-10: 0.2 mg via INTRAVENOUS

## 2014-12-10 SURGICAL SUPPLY — 39 items

## 2014-12-10 NOTE — Anesthesia Postprocedure Evaluation (Signed)
  Anesthesia Post-op Note  Patient: Veronica Reynolds  Procedure(s) Performed: Procedure(s) with comments: COLONOSCOPY WITH PROPOFOL (N/A) ESOPHAGOGASTRODUODENOSCOPY (EGD) WITH PROPOFOL (N/A) - GASTRIC BX  Anesthesia type:MAC  Patient location: PACU  Post pain: Pain level controlled  Post assessment: Post-op Vital signs reviewed, Patient's Cardiovascular Status Stable, Respiratory Function Stable, Patent Airway and No signs of Nausea or vomiting  Post vital signs: Reviewed and stable  Last Vitals:  Filed Vitals:   12/10/14 1145  BP: 98/67  Pulse: 64  Temp:   Resp: 19    Level of consciousness: awake, alert  and patient cooperative  Complications: No apparent anesthesia complications

## 2014-12-10 NOTE — Op Note (Signed)
[facility] Gastroenterology Patient Name: Veronica Reynolds Procedure Date: 12/10/2014 11:06 AM MRN: [id number] Account #: [id number] Date of Birth: Mar 06, 1980 Admit Type: Outpatient Age: 34 Room: [facility] OR ROOM 01 Gender: Female Note Status: Finalized Procedure:         Upper GI endoscopy Indications:       Nausea Providers:         Lucilla Lame, MD Referring MD:      Evonnie Pat (Referring MD) Medicines:         Propofol per Anesthesia Complications:     No immediate complications. Procedure:         Pre-Anesthesia Assessment:                    - Prior to the procedure, a History and Physical was                     performed, and patient medications and allergies were                     reviewed. The patient's tolerance of previous anesthesia                     was also reviewed. The risks and benefits of the procedure                     and the sedation options and risks were discussed with the                     patient. All questions were answered, and informed consent                     was obtained. Prior Anticoagulants: The patient has taken                     no previous anticoagulant or antiplatelet agents. ASA                     Grade Assessment: II - A patient with mild systemic                     disease. After reviewing the risks and benefits, the                     patient was deemed in satisfactory condition to undergo                     the procedure.                    After obtaining informed consent, the endoscope was passed                     under direct vision. Throughout the procedure, the                     patient's blood pressure, pulse, and oxygen saturations                     were monitored continuously. The was introduced through                     the mouth, and advanced to the second part of duodenum.  The upper GI endoscopy was accomplished without                     difficulty. The patient  tolerated the procedure well. Findings:      The examined esophagus was normal.      The entire examined stomach was normal. Biopsies were taken with a cold       forceps for Helicobacter pylori testing.      The examined duodenum was normal. Impression:        - Normal esophagus.                    - Normal stomach. Biopsied.                    - Normal examined duodenum. Recommendation:    - Await pathology results.                    - Perform a colonoscopy today. Procedure Code(s): --- Professional ---                    [phone], Esophagogastroduodenoscopy, flexible, transoral;                     with biopsy, single or multiple Diagnosis Code(s): --- Professional ---                    R11.0, Nausea CPT copyright 2014 American Medical Association. All rights reserved. The codes documented in this report are preliminary and upon coder review may  be revised to meet current compliance requirements. Lucilla Lame, MD 12/10/2014 11:18:24 AM This report has been signed electronically. Number of Addenda: 0 Note Initiated On: 12/10/2014 11:06 AM      [facility]

## 2014-12-10 NOTE — H&P (Signed)
[facility]  [address], [place], [place] [address] Phone: [phone] Fax : [phone]  Primary Care Physician:  Joylene Igo, CNM Primary Gastroenterologist:  Dr. Allen Norris  Pre-Procedure History & Physical: HPI:  Veronica Reynolds is a 34 y.o. female is here for an endoscopy and colonoscopy.   Past Medical History  Diagnosis Date  . Depression   . Sleep disturbance   . Anxiety   . Migraine     tension - daily, migraines - 3x/mo  . Asthma     as child    Past Surgical History  Procedure Laterality Date  . Anal fissue repair      Prior to Admission medications   Medication Sig Start Date End Date Taking? Authorizing Provider  Cholecalciferol (VITAMIN D PO) Take by mouth.   Yes Historical Provider, MD  clonazePAM (KLONOPIN) 2 MG tablet Take 2 mg by mouth daily.   Yes Historical Provider, MD  NORA-BE 0.35 MG tablet TAKE 1 TABLET BY MOUTH EVERY DAY 10/01/14  Yes Melody N Shambley, CNM  nortriptyline (PAMELOR) 10 MG capsule Take 1 capsule at bedtime for 1 week, then increase to 2 capsules at bedtime for 1 week, then increase to 3 capsules at bedtime and continue 11/25/14  Yes Historical Provider, MD  lamoTRIgine (LAMICTAL) 100 MG tablet  01/11/14   Historical Provider, MD  ondansetron (ZOFRAN) 8 MG tablet  02/19/14   Historical Provider, MD  sertraline (ZOLOFT) 100 MG tablet  01/11/14   Historical Provider, MD  sulfamethoxazole-trimethoprim (BACTRIM,SEPTRA) 400-80 MG per tablet Take 1 tablet by mouth 2 (two) times daily. 03/30/14   Historical Provider, MD    Allergies as of 11/26/2014 - Review Complete 10/08/2014  Allergen Reaction Noted  . Codeine Nausea And Vomiting 04/01/2014    Family History  Problem Relation Age of Onset  . Atrial fibrillation Mother   . Alcohol abuse Father   . Arthritis Maternal Grandmother     Social History   Social History  . Marital Status: Single    Spouse Name: N/A  . Number of Children: N/A  . Years of  Education: N/A   Occupational History  . Not on file.   Social History Main Topics  . Smoking status: Former Smoker -- 1.00 packs/day    Types: Cigarettes    Quit date: 07/22/2012  . Smokeless tobacco: Never Used  . Alcohol Use: No  . Drug Use: No  . Sexual Activity: Yes    Birth Control/ Protection: Pill   Other Topics Concern  . Not on file   Social History Narrative    Review of Systems: See HPI, otherwise negative ROS  Physical Exam: BP 111/64 mmHg  Pulse 69  Temp(Src) 98.6 F (37 C) (Temporal)  Resp 16  Ht 5\' 10"  (1.778 m)  Wt 242 lb (109.77 kg)  BMI 34.72 kg/m2  SpO2 97%  LMP 11/28/2014 (Exact Date) General:   Alert,  pleasant and cooperative in NAD Head:  Normocephalic and atraumatic. Neck:  Supple; no masses or thyromegaly. Lungs:  Clear throughout to auscultation.    Heart:  Regular rate and rhythm. Abdomen:  Soft, nontender and nondistended. Normal bowel sounds, without guarding, and without rebound.   Neurologic:  Alert and  oriented x4;  grossly normal neurologically.  Impression/Plan: Veronica Reynolds is here for an endoscopy and colonoscopy to be performed for hematochezia and nausea  Risks, benefits, limitations, and alternatives regarding  endoscopy and colonoscopy have been reviewed with the patient.  Questions  have been answered.  All parties agreeable.   Ollen Bowl, MD  12/10/2014, 11:04 AM

## 2014-12-10 NOTE — Anesthesia Preprocedure Evaluation (Signed)
Anesthesia Evaluation  Patient identified by MRN, date of birth, ID band Patient awake    Reviewed: Allergy & Precautions, NPO status , Patient's Chart, lab work & pertinent test results  Airway Mallampati: II  TM Distance: >3 FB Neck ROM: Full    Dental no notable dental hx.    Pulmonary asthma , former smoker,    Pulmonary exam normal breath sounds clear to auscultation       Cardiovascular negative cardio ROS Normal cardiovascular exam Rhythm:Regular Rate:Normal     Neuro/Psych  Headaches, negative psych ROS   GI/Hepatic negative GI ROS, Neg liver ROS,   Endo/Other  negative endocrine ROS  Renal/GU negative Renal ROS  negative genitourinary   Musculoskeletal negative musculoskeletal ROS (+)   Abdominal   Peds negative pediatric ROS (+)  Hematology negative hematology ROS (+)   Anesthesia Other Findings   Reproductive/Obstetrics negative OB ROS                             Anesthesia Physical Anesthesia Plan  ASA: II  Anesthesia Plan: MAC   Post-op Pain Management:    Induction: Intravenous  Airway Management Planned:   Additional Equipment:   Intra-op Plan:   Post-operative Plan: Extubation in OR  Informed Consent: I have reviewed the patients History and Physical, chart, labs and discussed the procedure including the risks, benefits and alternatives for the proposed anesthesia with the patient or authorized representative who has indicated his/her understanding and acceptance.   Dental advisory given  Plan Discussed with: CRNA  Anesthesia Plan Comments:         Anesthesia Quick Evaluation

## 2014-12-10 NOTE — Transfer of Care (Signed)
Immediate Anesthesia Transfer of Care Note  Patient: Veronica Reynolds  Procedure(s) Performed: Procedure(s) with comments: COLONOSCOPY WITH PROPOFOL (N/A) ESOPHAGOGASTRODUODENOSCOPY (EGD) WITH PROPOFOL (N/A) - GASTRIC BX  Patient Location: PACU  Anesthesia Type: MAC  Level of Consciousness: awake, alert  and patient cooperative  Airway and Oxygen Therapy: Patient Spontanous Breathing and Patient connected to supplemental oxygen  Post-op Assessment: Post-op Vital signs reviewed, Patient's Cardiovascular Status Stable, Respiratory Function Stable, Patent Airway and No signs of Nausea or vomiting  Post-op Vital Signs: Reviewed and stable  Complications: No apparent anesthesia complications

## 2014-12-10 NOTE — Op Note (Signed)
[facility] Gastroenterology Patient Name: Veronica Reynolds Procedure Date: 12/10/2014 11:05 AM MRN: [id number] Account #: [id number] Date of Birth: 1980/05/08 Admit Type: Outpatient Age: 34 Room: [facility] OR ROOM 01 Gender: Female Note Status: Finalized Procedure:         Colonoscopy Indications:       Hematochezia Providers:         Lucilla Lame, MD Medicines:         Propofol per Anesthesia Complications:     No immediate complications. Procedure:         Pre-Anesthesia Assessment:                    - Prior to the procedure, a History and Physical was                     performed, and patient medications and allergies were                     reviewed. The patient's tolerance of previous anesthesia                     was also reviewed. The risks and benefits of the procedure                     and the sedation options and risks were discussed with the                     patient. All questions were answered, and informed consent                     was obtained. Prior Anticoagulants: The patient has taken                     no previous anticoagulant or antiplatelet agents. ASA                     Grade Assessment: II - A patient with mild systemic                     disease. After reviewing the risks and benefits, the                     patient was deemed in satisfactory condition to undergo                     the procedure.                    After obtaining informed consent, the colonoscope was                     passed under direct vision. Throughout the procedure, the                     patient's blood pressure, pulse, and oxygen saturations                     were monitored continuously. The Olympus CF-HQ190L                     Colonoscope (S#. [id number]) was introduced through the anus                     and advanced to the the cecum, identified by appendiceal  orifice and ileocecal valve. The colonoscopy was performed           without difficulty. The patient tolerated the procedure                     well. The quality of the bowel preparation was excellent. Findings:      The perianal and digital rectal examinations were normal.      Non-bleeding internal hemorrhoids were found during retroflexion. The       hemorrhoids were Grade II (internal hemorrhoids that prolapse but reduce       spontaneously). Impression:        - Non-bleeding internal hemorrhoids.                    - No specimens collected. Recommendation:    - High fiber diet. Procedure Code(s): --- Professional ---                    [phone], Colonoscopy, flexible; diagnostic, including                     collection of specimen(s) by brushing or washing, when                     performed (separate procedure) Diagnosis Code(s): --- Professional ---                    K92.1, Melena                    K64.1, Second degree hemorrhoids CPT copyright 2014 American Medical Association. All rights reserved. The codes documented in this report are preliminary and upon coder review may  be revised to meet current compliance requirements. Lucilla Lame, MD 12/10/2014 11:31:57 AM This report has been signed electronically. Number of Addenda: 0 Note Initiated On: 12/10/2014 11:05 AM Scope Withdrawal Time: 0 hours 7 minutes 58 seconds  Total Procedure Duration: 0 hours 10 minutes 6 seconds       [facility]

## 2014-12-10 NOTE — Anesthesia Procedure Notes (Signed)
Procedure Name: MAC Performed by: Caelen Higinbotham Pre-anesthesia Checklist: Patient identified, Emergency Drugs available, Suction available, Timeout performed and Patient being monitored Patient Re-evaluated:Patient Re-evaluated prior to inductionOxygen Delivery Method: Nasal cannula Placement Confirmation: positive ETCO2     

## 2014-12-13 ENCOUNTER — Encounter: Payer: Self-pay | Admitting: Gastroenterology

## 2014-12-15 ENCOUNTER — Telehealth: Payer: Self-pay

## 2014-12-15 NOTE — Telephone Encounter (Signed)
Results sent to patient via online portal. Waiting for her response.

## 2014-12-15 NOTE — Telephone Encounter (Signed)
-----   Message from Lucilla Lame, MD sent at 12/15/2014  7:56 AM EST ----- Let the patient know that the biopsy showed formation of the stomach that no infection. The patient should be started on a proton pump inhibitor for this. She should be treated for 8 weeks and see if she feels any better.

## 2014-12-24 ENCOUNTER — Other Ambulatory Visit: Payer: Self-pay | Admitting: Obstetrics and Gynecology

## 2014-12-25 ENCOUNTER — Other Ambulatory Visit: Payer: Self-pay | Admitting: Obstetrics and Gynecology

## 2014-12-27 ENCOUNTER — Telehealth: Payer: Self-pay | Admitting: Obstetrics and Gynecology

## 2014-12-27 NOTE — Telephone Encounter (Signed)
rx clonazepam 2 mg was rejected. She wanted to check on that and see if you got it approved

## 2014-12-28 NOTE — Telephone Encounter (Signed)
Done-ac 

## 2015-01-06 ENCOUNTER — Ambulatory Visit: Payer: BLUE CROSS/BLUE SHIELD | Admitting: Gastroenterology

## 2015-01-14 ENCOUNTER — Other Ambulatory Visit: Payer: BLUE CROSS/BLUE SHIELD

## 2015-01-14 ENCOUNTER — Encounter: Payer: Self-pay | Admitting: *Deleted

## 2015-01-14 NOTE — Patient Instructions (Signed)
  Your procedure is scheduled on: 01-25-15 Report to [facility]. To find out your arrival time please call [phone] between 1PM - 3PM on 01-21-15  Remember: Instructions that are not followed completely may result in serious medical risk, up to and including death, or upon the discretion of your surgeon and anesthesiologist your surgery may need to be rescheduled.    _X___ 1. Do not eat food or drink liquids after midnight. No gum chewing or hard candies.     _X___ 2. No Alcohol for 24 hours before or after surgery.   ____ 3. Bring all medications with you on the day of surgery if instructed.    _X___ 4. Notify your doctor if there is any change in your medical condition     (cold, fever, infections).     Do not wear jewelry, make-up, hairpins, clips or nail polish.  Do not wear lotions, powders, or perfumes. You may wear deodorant.  Do not shave 48 hours prior to surgery. Men may shave face and neck.  Do not bring valuables to the hospital.    [facility] is not responsible for any belongings or valuables.               Contacts, dentures or bridgework may not be worn into surgery.  Leave your suitcase in the car. After surgery it may be brought to your room.  For patients admitted to the hospital, discharge time is determined by your treatment team.   Patients discharged the day of surgery will not be allowed to drive home.   Please read over the following fact sheets that you were given:     ____ Take these medicines the morning of surgery with A SIP OF WATER:    1. NONE  2.   3.   4.  5.  6.  ____ Fleet Enema (as directed)   ____ Use CHG Soap as directed  ____ Use inhalers on the day of surgery  ____ Stop metformin 2 days prior to surgery    ____ Take 1/2 of usual insulin dose the night before surgery and none on the morning of surgery.   ____ Stop Coumadin/Plavix/aspirin-N/A  ____ Stop Anti-inflammatories-NO NSAIDS OR ASA  PRODUCTS 7 DAYS PRIOR TO SURGERY-TYLENOL OK   ____ Stop supplements until after surgery.    ____ Bring C-Pap to the hospital.

## 2015-01-25 ENCOUNTER — Ambulatory Visit: Payer: BLUE CROSS/BLUE SHIELD | Admitting: Anesthesiology

## 2015-01-25 ENCOUNTER — Ambulatory Visit
Admission: RE | Admit: 2015-01-25 | Discharge: 2015-01-25 | Disposition: A | Discharge status: Home / Self Care | Payer: BLUE CROSS/BLUE SHIELD | Source: Ambulatory Visit | Attending: Orthopedic Surgery | Admitting: Orthopedic Surgery

## 2015-01-25 ENCOUNTER — Encounter
Admission: RE | Disposition: A | Discharge status: Home / Self Care | Payer: Self-pay | Source: Ambulatory Visit | Attending: Orthopedic Surgery

## 2015-01-25 DIAGNOSIS — G5603 Carpal tunnel syndrome, bilateral upper limbs: Secondary | ICD-10-CM | POA: Insufficient documentation

## 2015-01-25 DIAGNOSIS — Z9889 Other specified postprocedural states: Secondary | ICD-10-CM | POA: Insufficient documentation

## 2015-01-25 DIAGNOSIS — Z6835 Body mass index (BMI) 35.0-35.9, adult: Secondary | ICD-10-CM | POA: Diagnosis not present

## 2015-01-25 DIAGNOSIS — F419 Anxiety disorder, unspecified: Secondary | ICD-10-CM | POA: Diagnosis not present

## 2015-01-25 DIAGNOSIS — Z885 Allergy status to narcotic agent status: Secondary | ICD-10-CM | POA: Insufficient documentation

## 2015-01-25 DIAGNOSIS — E669 Obesity, unspecified: Secondary | ICD-10-CM | POA: Insufficient documentation

## 2015-01-25 DIAGNOSIS — Z79899 Other long term (current) drug therapy: Secondary | ICD-10-CM | POA: Diagnosis not present

## 2015-01-25 HISTORY — DX: Gastro-esophageal reflux disease without esophagitis: K21.9

## 2015-01-25 HISTORY — PX: CARPAL TUNNEL RELEASE: SHX101

## 2015-01-25 LAB — POCT URINE PREGNANCY: Preg Test, Ur: NEGATIVE

## 2015-01-25 LAB — POCT PREGNANCY, URINE: Preg Test, Ur: NEGATIVE

## 2015-01-25 SURGERY — CARPAL TUNNEL RELEASE
Anesthesia: General | Laterality: Left

## 2015-01-25 MED ORDER — FAMOTIDINE 20 MG PO TABS
ORAL_TABLET | ORAL | Status: AC
  Filled 2015-01-25: qty 1

## 2015-01-25 MED ORDER — PROPOFOL 10 MG/ML IV BOLUS
INTRAVENOUS | Status: DC | PRN
  Administered 2015-01-25: 200 mg via INTRAVENOUS

## 2015-01-25 MED ORDER — FENTANYL CITRATE (PF) 100 MCG/2ML IJ SOLN
INTRAMUSCULAR | Status: DC | PRN
  Administered 2015-01-25 (×2): 50 ug via INTRAVENOUS

## 2015-01-25 MED ORDER — FENTANYL CITRATE (PF) 100 MCG/2ML IJ SOLN: 25.0000 ug | INTRAMUSCULAR | Status: DC | PRN

## 2015-01-25 MED ORDER — BUPIVACAINE HCL (PF) 0.5 % IJ SOLN
INTRAMUSCULAR | Status: AC
  Filled 2015-01-25: qty 30

## 2015-01-25 MED ORDER — BUPIVACAINE HCL 0.5 % IJ SOLN
INTRAMUSCULAR | Status: DC | PRN
  Administered 2015-01-25: 10 mL

## 2015-01-25 MED ORDER — MIDAZOLAM HCL 2 MG/2ML IJ SOLN
INTRAMUSCULAR | Status: DC | PRN
Start: 2015-01-25 — End: 2015-01-25
  Administered 2015-01-25: 2 mg via INTRAVENOUS

## 2015-01-25 MED ORDER — DEXAMETHASONE SODIUM PHOSPHATE 10 MG/ML IJ SOLN
INTRAMUSCULAR | Status: DC | PRN
  Administered 2015-01-25: 10 mg via INTRAVENOUS

## 2015-01-25 MED ORDER — ONDANSETRON HCL 4 MG/2ML IJ SOLN: 4.0000 mg | Freq: Once | INTRAMUSCULAR | Status: DC | PRN

## 2015-01-25 MED ORDER — ONDANSETRON HCL 4 MG/2ML IJ SOLN
INTRAMUSCULAR | Status: DC | PRN
  Administered 2015-01-25: 4 mg via INTRAVENOUS

## 2015-01-25 MED ORDER — FAMOTIDINE 20 MG PO TABS
20.0000 mg | ORAL_TABLET | Freq: Once | ORAL | Status: AC
  Administered 2015-01-25: 20 mg via ORAL

## 2015-01-25 MED ORDER — OXYCODONE-ACETAMINOPHEN 5-325 MG PO TABS: 1.0000 | ORAL_TABLET | Freq: Four times a day (QID) | ORAL | Status: DC | PRN

## 2015-01-25 MED ORDER — LACTATED RINGERS IV SOLN
INTRAVENOUS | Status: DC
  Administered 2015-01-25: 13:00:00 via INTRAVENOUS

## 2015-01-25 MED ORDER — LIDOCAINE HCL (CARDIAC) 20 MG/ML IV SOLN
INTRAVENOUS | Status: DC | PRN
  Administered 2015-01-25: 60 mg via INTRAVENOUS

## 2015-01-25 MED ORDER — HYDROCODONE-ACETAMINOPHEN 5-325 MG PO TABS: 1.0000 | ORAL_TABLET | Freq: Four times a day (QID) | ORAL | Status: DC | PRN

## 2015-01-25 MED ORDER — LIDOCAINE HCL (PF) 1 % IJ SOLN
INTRAMUSCULAR | Status: AC
Start: 2015-01-25 — End: 2015-01-25
  Filled 2015-01-25: qty 30

## 2015-01-25 SURGICAL SUPPLY — 25 items
BANDAGE ACE 3X5.8 VEL STRL LF (GAUZE/BANDAGES/DRESSINGS) ×3 IMPLANT
BNDG ESMARK 4X12 TAN STRL LF (GAUZE/BANDAGES/DRESSINGS) IMPLANT
CANISTER SUCT 1200ML W/VALVE (MISCELLANEOUS) ×3 IMPLANT
CHLORAPREP W/TINT 26ML (MISCELLANEOUS) ×3 IMPLANT
ELECT CAUTERY NEEDLE 2.0 MIC (NEEDLE) ×3 IMPLANT
ELECT CAUTERY NEEDLE TIP 1.0 (MISCELLANEOUS) ×3
ELECTRODE CAUTERY NEDL TIP 1.0 (MISCELLANEOUS) ×1 IMPLANT
GAUZE PETRO XEROFOAM 1X8 (MISCELLANEOUS) ×3 IMPLANT
GAUZE SPONGE 4X4 12PLY STRL (GAUZE/BANDAGES/DRESSINGS) ×3 IMPLANT
GLOVE BIOGEL PI IND STRL 9 (GLOVE) ×1 IMPLANT
GLOVE BIOGEL PI INDICATOR 9 (GLOVE) ×2
GLOVE SURG ORTHO 9.0 STRL STRW (GLOVE) ×3 IMPLANT
GOWN SPECIALTY ULTRA XL (MISCELLANEOUS) ×3 IMPLANT
GOWN STRL REUS W/ TWL LRG LVL3 (GOWN DISPOSABLE) ×1 IMPLANT
GOWN STRL REUS W/TWL 2XL LVL3 (GOWN DISPOSABLE) ×3 IMPLANT
GOWN STRL REUS W/TWL LRG LVL3 (GOWN DISPOSABLE) ×2
KIT RM TURNOVER STRD PROC AR (KITS) ×3 IMPLANT
NS IRRIG 500ML POUR BTL (IV SOLUTION) ×3 IMPLANT
PACK EXTREMITY ARMC (MISCELLANEOUS) ×3 IMPLANT
PAD CAST CTTN 4X4 STRL (SOFTGOODS) ×1 IMPLANT
PADDING CAST COTTON 4X4 STRL (SOFTGOODS) ×2
STOCKINETTE STRL 4IN 9604848 (GAUZE/BANDAGES/DRESSINGS) ×3 IMPLANT
SUT ETHILON 4-0 (SUTURE) ×2
SUT ETHILON 4-0 FS2 18XMFL BLK (SUTURE) ×1
SUTURE ETHLN 4-0 FS2 18XMF BLK (SUTURE) ×1 IMPLANT

## 2015-01-25 NOTE — Anesthesia Procedure Notes (Signed)
Procedure Name: LMA Insertion Date/Time: 01/25/2015 3:01 PM Performed by: Silvana Newness Pre-anesthesia Checklist: Patient identified, Emergency Drugs available, Suction available, Patient being monitored and Timeout performed Patient Re-evaluated:Patient Re-evaluated prior to inductionOxygen Delivery Method: Circle system utilized Preoxygenation: Pre-oxygenation with 100% oxygen Intubation Type: IV induction Ventilation: Mask ventilation without difficulty LMA: LMA inserted LMA Size: 3.5 Number of attempts: 1 Placement Confirmation: positive ETCO2 and breath sounds checked- equal and bilateral Tube secured with: Tape Dental Injury: Teeth and Oropharynx as per pre-operative assessment

## 2015-01-25 NOTE — H&P (Signed)
Reviewed paper H+P, will be scanned into chart. No changes noted.  

## 2015-01-25 NOTE — Anesthesia Preprocedure Evaluation (Signed)
Anesthesia Evaluation  Patient identified by MRN, date of birth, ID band Patient awake    Reviewed: Allergy & Precautions, H&P , NPO status , Patient's Chart, lab work & pertinent test results, reviewed documented beta blocker date and time   Airway Mallampati: II  TM Distance: >3 FB Neck ROM: full    Dental  (+) Teeth Intact   Pulmonary neg pulmonary ROS, asthma , former smoker,    Pulmonary exam normal        Cardiovascular Exercise Tolerance: Good negative cardio ROS Normal cardiovascular exam Rate:Normal     Neuro/Psych  Headaches, PSYCHIATRIC DISORDERS Very nervous about being awake during procedure and prefers GA to RA with block.  JAnegative neurological ROS  negative psych ROS   GI/Hepatic negative GI ROS, Neg liver ROS, GERD  ,  Endo/Other  negative endocrine ROS  Renal/GU negative Renal ROS  negative genitourinary   Musculoskeletal   Abdominal   Peds  Hematology negative hematology ROS (+)   Anesthesia Other Findings   Reproductive/Obstetrics negative OB ROS                             Anesthesia Physical Anesthesia Plan  ASA: II  Anesthesia Plan: General LMA   Post-op Pain Management:    Induction:   Airway Management Planned:   Additional Equipment:   Intra-op Plan:   Post-operative Plan:   Informed Consent: I have reviewed the patients History and Physical, chart, labs and discussed the procedure including the risks, benefits and alternatives for the proposed anesthesia with the patient or authorized representative who has indicated his/her understanding and acceptance.     Plan Discussed with: CRNA  Anesthesia Plan Comments:         Anesthesia Quick Evaluation

## 2015-01-25 NOTE — Discharge Instructions (Signed)
Keep arm elevated, work fingers is much as possible. Loosen Ace wrap prior to discharge and if fingers swell

## 2015-01-25 NOTE — Transfer of Care (Signed)
Immediate Anesthesia Transfer of Care Note  Patient: Veronica Reynolds  Procedure(s) Performed: Procedure(s): CARPAL TUNNEL RELEASE (Left)  Patient Location: PACU  Anesthesia Type:General  Level of Consciousness: awake, alert , oriented and patient cooperative  Airway & Oxygen Therapy: Patient Spontanous Breathing and Patient connected to face mask oxygen  Post-op Assessment: Report given to RN, Post -op Vital signs reviewed and stable and Patient moving all extremities X 4  Post vital signs: Reviewed and stable  Last Vitals:  Filed Vitals:   01/25/15 1304  BP: 146/106  Pulse: 95  Temp: 36.7 C  Resp: 18    Complications: No apparent anesthesia complications

## 2015-01-25 NOTE — Anesthesia Postprocedure Evaluation (Signed)
Anesthesia Post Note  Patient: Veronica Reynolds  Procedure(s) Performed: Procedure(s) (LRB): CARPAL TUNNEL RELEASE (Left)  Patient location during evaluation: PACU Anesthesia Type: General Level of consciousness: awake and alert Pain management: pain level controlled Vital Signs Assessment: post-procedure vital signs reviewed and stable Respiratory status: respiratory function stable and spontaneous breathing Cardiovascular status: stable Anesthetic complications: no    Last Vitals:  Filed Vitals:   01/25/15 1304 01/25/15 1533  BP: 146/106 114/75  Pulse: 95 84  Temp: 36.7 C 36.4 C  Resp: 18 20    Last Pain: There were no vitals filed for this visit.               Frank Pilger K

## 2015-01-25 NOTE — Op Note (Signed)
01/25/2015  3:34 PM  PATIENT:  Veronica Reynolds  35 y.o. female  PRE-OPERATIVE DIAGNOSIS:  CARPAL TUNNEL syndrome left wrist  POST-OPERATIVE DIAGNOSIS:  carpal tunnel syndrome left wrist  PROCEDURE:  Procedure(s): CARPAL TUNNEL RELEASE (Left)  SURGEON: Laurene Footman, MD  ASSISTANTS: None  ANESTHESIA:   general  EBL:  Total I/O In: 500 [I.V.:500] Out: 5 [Blood:5]  BLOOD ADMINISTERED:none  DRAINS: none   LOCAL MEDICATIONS USED:  MARCAINE     SPECIMEN:  No Specimen  DISPOSITION OF SPECIMEN:  N/A  COUNTS:  YES  TOURNIQUET:   Total Tourniquet Time Documented: Upper Arm (Left) - 10 minutes Total: Upper Arm (Left) - 10 minutes   IMPLANTS: None  DICTATION: .Dragon Dictation patient was brought to the operating room and after adequate general anesthesia was obtained, the left arm was prepped and draped in sterile fashion. After patient identification and timeout procedures were completed, tourniquet was raised to 250 mm mercury. Incision was made in line with ring metacarpal approximate 2 and half centimeters in length. The transverse carpal ligament was identified and opened with a vascular hemostat placed underneath to protect the underlying structures releases carried out distally to a point where there is fat consistent with lack of compression on the nerve. Going proximally there is a band proximal to the wrist flexion crease about 2 cm in length that appear to be giving pressure to the nerve and after release of this the median nerve had good vascular blush and it appeared compression was released. Through no masses within the carpal tunnel is mild flexor tenosynovitis. The wound was irrigated and then infiltrated with 10 cc half percent Sensorcaine without epinephrine for postop analgesia. Wounds closed with simple 4-0 nylon skin sutures. Xeroform 4 x 4 web roll and Ace wrap applied  PLAN OF CARE: Discharge to home after PACU  PATIENT DISPOSITION:  PACU - hemodynamically  stable.

## 2015-01-26 ENCOUNTER — Encounter: Payer: Self-pay | Admitting: Orthopedic Surgery

## 2015-01-31 LAB — POCT PREGNANCY, URINE: Preg Test, Ur: NEGATIVE

## 2015-02-18 ENCOUNTER — Other Ambulatory Visit: Payer: Self-pay | Admitting: Obstetrics and Gynecology

## 2015-02-21 ENCOUNTER — Ambulatory Visit: Payer: BLUE CROSS/BLUE SHIELD | Admitting: Gastroenterology

## 2015-02-21 ENCOUNTER — Encounter: Payer: Self-pay | Admitting: Gastroenterology

## 2015-02-21 ENCOUNTER — Ambulatory Visit (INDEPENDENT_AMBULATORY_CARE_PROVIDER_SITE_OTHER): Payer: BLUE CROSS/BLUE SHIELD | Admitting: Gastroenterology

## 2015-02-21 ENCOUNTER — Other Ambulatory Visit: Payer: Self-pay

## 2015-02-21 VITALS — BP 124/73 | HR 75 | Temp 98.7°F | Ht 70.0 in | Wt 242.0 lb

## 2015-02-21 DIAGNOSIS — K921 Melena: Secondary | ICD-10-CM

## 2015-02-21 MED ORDER — HYDROCORTISONE ACETATE 25 MG RE SUPP: 25.0000 mg | Freq: Every day | RECTAL | Status: DC

## 2015-02-21 NOTE — Telephone Encounter (Signed)
Please see rx refill 

## 2015-02-21 NOTE — Progress Notes (Signed)
   Primary Care Physician: No PCP Per Patient  Primary Gastroenterologist:  Dr. Lucilla Lame  Chief Complaint  Patient presents with  . Follow up procedure results    colonoscopy and EGD on 12/10/14    HPI: Veronica Reynolds is a 35 y.o. female here for follow-up after her EGD and colonoscopy. The patient states she still has some rectal bleeding that occurs approximate once a week. The patient states that she has pain during her bowel movements. The patient's colonoscopy did not show any internal hemorrhoids. The patient also reports that her abdominal symptoms such as nausea has decreased since she has been avoiding certain foods including fatty foods or greasy foods. The upper endoscopy did show her to have some chronic gastritis.  Current Outpatient Prescriptions  Medication Sig Dispense Refill  . Cholecalciferol (VITAMIN D PO) Take by mouth.    . clonazePAM (KLONOPIN) 2 MG tablet TAKE 1 TABLET BY MOUTH EVERY NIGHT AT BEDTIME AS NEEDED 60 tablet 0  . gabapentin (NEURONTIN) 100 MG capsule Take 200 mg by mouth every evening.    Marland Kitchen HYDROcodone-acetaminophen (NORCO/VICODIN) 5-325 MG tablet TAKE 1 TABLET PO EVERY 6 HOURS PRN FOR PAIN  0  . NORA-BE 0.35 MG tablet TAKE 1 TABLET BY MOUTH EVERY DAY 28 tablet 0  . nortriptyline (PAMELOR) 10 MG capsule Take 30 mg by mouth at bedtime.    . hydrocortisone (ANUSOL-HC) 25 MG suppository Place 1 suppository (25 mg total) rectally at bedtime. 14 suppository 1   No current facility-administered medications for this visit.    Allergies as of 02/21/2015 - Review Complete 02/21/2015  Allergen Reaction Noted  . Codeine Nausea And Vomiting 04/01/2014    ROS:  General: Negative for anorexia, weight loss, fever, chills, fatigue, weakness. ENT: Negative for hoarseness, difficulty swallowing , nasal congestion. CV: Negative for chest pain, angina, palpitations, dyspnea on exertion, peripheral edema.  Respiratory: Negative for dyspnea at rest, dyspnea on  exertion, cough, sputum, wheezing.  GI: See history of present illness. GU:  Negative for dysuria, hematuria, urinary incontinence, urinary frequency, nocturnal urination.  Endo: Negative for unusual weight change.    Physical Examination:   BP 124/73 mmHg  Pulse 75  Temp(Src) 98.7 F (37.1 C) (Oral)  Ht 5\' 10"  (1.778 m)  Wt 242 lb (109.77 kg)  BMI 34.72 kg/m2  General: Well-nourished, well-developed in no acute distress.  Eyes: No icterus. Conjunctivae pink. Extremities: No lower extremity edema. No clubbing or deformities. Neuro: Alert and oriented x 3.  Grossly intact. Skin: Warm and dry, no jaundice.   Psych: Alert and cooperative, normal mood and affect.  Labs:    Imaging Studies: No results found.  Assessment and Plan:   NOTNAMED Reynolds is a 35 y.o. y/o female comes in today with a history of having an EGD and colonoscopy for rectal bleeding and nausea. The patient states her nausea is better when she avoids foods that disagree with her. The patient's rectal bleeding has decreased and she states it is bright red blood on the toilet paper and only happening about once a week. The patient will be started on Anusol suppositories once before she goes to sleep for 2 weeks. She will given a refill in case the rectal bleeding comes back. The patient has been explained the plan and agrees with it.   Note: This dictation was prepared with Dragon dictation along with smaller phrase technology. Any transcriptional errors that result from this process are unintentional.

## 2015-02-23 ENCOUNTER — Other Ambulatory Visit: Payer: Self-pay | Admitting: Obstetrics and Gynecology

## 2015-02-23 NOTE — Telephone Encounter (Signed)
Medication was faxed to pharmacy

## 2015-02-25 ENCOUNTER — Telehealth: Payer: Self-pay | Admitting: Obstetrics and Gynecology

## 2015-02-25 NOTE — Telephone Encounter (Signed)
colazepam refill was sent here and not return to pharmacy will you check on this

## 2015-02-25 NOTE — Telephone Encounter (Signed)
Faxed rx again to [facility]

## 2015-04-25 ENCOUNTER — Other Ambulatory Visit (INDEPENDENT_AMBULATORY_CARE_PROVIDER_SITE_OTHER): Payer: BLUE CROSS/BLUE SHIELD | Admitting: Obstetrics and Gynecology

## 2015-04-25 ENCOUNTER — Other Ambulatory Visit: Payer: BLUE CROSS/BLUE SHIELD

## 2015-04-25 DIAGNOSIS — R300 Dysuria: Secondary | ICD-10-CM | POA: Diagnosis not present

## 2015-04-25 LAB — POCT URINALYSIS DIPSTICK
Glucose, UA: NEGATIVE
Ketones, UA: NEGATIVE
NITRITE UA: NEGATIVE
SPEC GRAV UA: 1.02
Urobilinogen, UA: 0.2
pH, UA: 6

## 2015-04-27 ENCOUNTER — Other Ambulatory Visit: Payer: Self-pay | Admitting: Obstetrics and Gynecology

## 2015-04-27 LAB — URINE CULTURE

## 2015-04-27 MED ORDER — NITROFURANTOIN MONOHYD MACRO 100 MG PO CAPS: 100.0000 mg | ORAL_CAPSULE | Freq: Two times a day (BID) | ORAL | Status: DC

## 2015-05-18 ENCOUNTER — Other Ambulatory Visit: Payer: Self-pay | Admitting: Obstetrics and Gynecology

## 2015-06-09 ENCOUNTER — Other Ambulatory Visit: Payer: Self-pay | Admitting: Obstetrics and Gynecology

## 2015-06-16 ENCOUNTER — Telehealth: Payer: Self-pay | Admitting: Obstetrics and Gynecology

## 2015-06-16 NOTE — Telephone Encounter (Signed)
Veronica Reynolds called saying her pharmacy called her to tell her the Rx of Klonopin needs a pre-auth. She has two pills left. Please give her a phone call regarding this if necessary.  Pt's ph# [phone] Thank you.

## 2015-06-17 ENCOUNTER — Other Ambulatory Visit: Payer: Self-pay | Admitting: Obstetrics and Gynecology

## 2015-06-17 MED ORDER — CLONAZEPAM 2 MG PO TABS: 2.0000 mg | ORAL_TABLET | Freq: Every evening | ORAL | Status: DC | PRN

## 2015-06-17 NOTE — Telephone Encounter (Signed)
Pt needs rx please

## 2015-06-17 NOTE — Telephone Encounter (Signed)
done

## 2015-06-17 NOTE — Telephone Encounter (Signed)
Done-ac 

## 2015-06-21 ENCOUNTER — Telehealth: Payer: Self-pay | Admitting: Obstetrics and Gynecology

## 2015-06-21 NOTE — Telephone Encounter (Signed)
Done-ac 

## 2015-06-21 NOTE — Telephone Encounter (Signed)
Can you send medication to [facility] on [address] instead of [facility] in [place]. she is having problem a with them (clonazapam)  She wants it changed permanently

## 2015-07-08 ENCOUNTER — Other Ambulatory Visit: Payer: Self-pay | Admitting: Obstetrics and Gynecology

## 2015-08-05 ENCOUNTER — Other Ambulatory Visit: Payer: Self-pay | Admitting: Obstetrics and Gynecology

## 2015-10-13 ENCOUNTER — Encounter: Payer: Self-pay | Admitting: Obstetrics and Gynecology

## 2015-10-13 ENCOUNTER — Other Ambulatory Visit: Payer: Self-pay | Admitting: Obstetrics and Gynecology

## 2015-10-13 ENCOUNTER — Ambulatory Visit (INDEPENDENT_AMBULATORY_CARE_PROVIDER_SITE_OTHER): Payer: BLUE CROSS/BLUE SHIELD | Admitting: Obstetrics and Gynecology

## 2015-10-13 VITALS — BP 109/72 | HR 103 | Ht 70.0 in | Wt 240.2 lb

## 2015-10-13 DIAGNOSIS — Z01419 Encounter for gynecological examination (general) (routine) without abnormal findings: Secondary | ICD-10-CM | POA: Diagnosis not present

## 2015-10-13 DIAGNOSIS — E669 Obesity, unspecified: Secondary | ICD-10-CM | POA: Diagnosis not present

## 2015-10-13 DIAGNOSIS — F419 Anxiety disorder, unspecified: Secondary | ICD-10-CM | POA: Insufficient documentation

## 2015-10-13 MED ORDER — CLONAZEPAM 2 MG PO TABS: 2.0000 mg | ORAL_TABLET | Freq: Every evening | ORAL | 2 refills | Status: DC | PRN

## 2015-10-13 MED ORDER — NORETHINDRONE 0.35 MG PO TABS: 1.0000 | ORAL_TABLET | Freq: Every day | ORAL | 12 refills | Status: DC

## 2015-10-13 NOTE — Progress Notes (Signed)
Subjective:   Veronica Reynolds is a 35 y.o. G43P0001 Caucasian female here for a routine well-woman exam.  Patient's last menstrual period was 10/08/2015.    Current complaints: none PCP: me       Does need labs  Social History: Sexual: heterosexual Marital Status: same sex partner Living situation: with family Occupation: unknown occupation Tobacco/alcohol: no tobacco use Illicit drugs: no history of illicit drug use  The following portions of the patient's history were reviewed and updated as appropriate: allergies, current medications, past family history, past medical history, past social history, past surgical history and problem list.  Past Medical History Past Medical History:  Diagnosis Date  . Anxiety   . Asthma    as child  . Depression   . GERD (gastroesophageal reflux disease)   . Migraine    tension - daily, migraines - 3x/mo  . Sleep disturbance     Past Surgical History Past Surgical History:  Procedure Laterality Date  . anal fissue repair    . CARPAL TUNNEL RELEASE Left 01/25/2015   Procedure: CARPAL TUNNEL RELEASE;  Surgeon: Hessie Knows, MD;  Location: [facility];  Service: Orthopedics;  Laterality: Left;  . COLONOSCOPY WITH PROPOFOL N/A 12/10/2014   Procedure: COLONOSCOPY WITH PROPOFOL;  Surgeon: Lucilla Lame, MD;  Location: [place];  Service: Endoscopy;  Laterality: N/A;  . ESOPHAGOGASTRODUODENOSCOPY (EGD) WITH PROPOFOL N/A 12/10/2014   Procedure: ESOPHAGOGASTRODUODENOSCOPY (EGD) WITH PROPOFOL;  Surgeon: Lucilla Lame, MD;  Location: [place];  Service: Endoscopy;  Laterality: N/A;  GASTRIC BX    Gynecologic History G1P0001  Patient's last menstrual period was 10/08/2015. Contraception: OCP (estrogen/progesterone) Last Pap: 2015. Results were: normal   Obstetric History OB History  Gravida Para Term Preterm AB Living  1 1 0 0 0 1  SAB TAB Ectopic Multiple Live Births  0 0 0 0      # Outcome Date GA Lbr Len/2nd Weight Sex  Delivery Anes PTL Lv  1 Para             Obstetric Comments  1st Menstrual Cycle: 10    1st Pregnancy:  20    Current Medications Current Outpatient Prescriptions on File Prior to Visit  Medication Sig Dispense Refill  . clonazePAM (KLONOPIN) 2 MG tablet Take 1 tablet (2 mg total) by mouth at bedtime as needed. 60 tablet 2  . JOLIVETTE 0.35 MG tablet TAKE 1 TABLET BY MOUTH EVERY DAY 28 tablet 4  . nortriptyline (PAMELOR) 10 MG capsule Take 30 mg by mouth at bedtime.    . Cholecalciferol (VITAMIN D PO) Take by mouth.    . gabapentin (NEURONTIN) 100 MG capsule Take 200 mg by mouth every evening.    Marland Kitchen HYDROcodone-acetaminophen (NORCO/VICODIN) 5-325 MG tablet TAKE 1 TABLET PO EVERY 6 HOURS PRN FOR PAIN  0  . hydrocortisone (ANUSOL-HC) 25 MG suppository Place 1 suppository (25 mg total) rectally at bedtime. (Patient not taking: Reported on 10/13/2015) 14 suppository 1  . nitrofurantoin, macrocrystal-monohydrate, (MACROBID) 100 MG capsule Take 1 capsule (100 mg total) by mouth 2 (two) times daily. (Patient not taking: Reported on 10/13/2015) 14 capsule 1   No current facility-administered medications on file prior to visit.     Review of Systems Patient denies any headaches, blurred vision, shortness of breath, chest pain, abdominal pain, problems with bowel movements, urination, or intercourse.  Objective:  BP 109/72   Pulse (!) 103   Ht 5\' 10"  (1.778 m)   Wt 240 lb 3.2 oz (  109 kg)   LMP 10/08/2015   BMI 34.47 kg/m  Physical Exam  General:  Well developed, well nourished, no acute distress. She is alert and oriented x3. Skin:  Warm and dry Neck:  Midline trachea, no thyromegaly or nodules Cardiovascular: Regular rate and rhythm, no murmur heard Lungs:  Effort normal, all lung fields clear to auscultation bilaterally Breasts:  No dominant palpable mass, retraction, or nipple discharge Abdomen:  Soft, non tender, no hepatosplenomegaly or masses Pelvic:  External genitalia is normal  in appearance.  The vagina is normal in appearance. The cervix is bulbous, no CMT.  Thin prep pap is done with HR HPV cotesting. Uterus is felt to be normal size, shape, and contour.  No adnexal masses or tenderness noted. Extremities:  No swelling or varicosities noted Psych:  She has a normal mood and affect  Assessment:   Healthy well-woman exam Anxiety Obesity   Plan:  Labs obtained meds refilled  will return son to start weight loss program  F/U 1 year for AE, or sooner if needed   Melody Rockney Ghee, CNM

## 2015-10-13 NOTE — Patient Instructions (Signed)
Preventive Care for Adults, Female A healthy lifestyle and preventive care can promote health and wellness. Preventive health guidelines for women include the following key practices.  A routine yearly physical is a good way to check with your health care provider about your health and preventive screening. It is a chance to share any concerns and updates on your health and to receive a thorough exam.  Visit your dentist for a routine exam and preventive care every 6 months. Brush your teeth twice a day and floss once a day. Good oral hygiene prevents tooth decay and gum disease.  The frequency of eye exams is based on your age, health, family medical history, use of contact lenses, and other factors. Follow your health care provider's recommendations for frequency of eye exams.  Eat a healthy diet. Foods like vegetables, fruits, whole grains, low-fat dairy products, and lean protein foods contain the nutrients you need without too many calories. Decrease your intake of foods high in solid fats, added sugars, and salt. Eat the right amount of calories for you.Get information about a proper diet from your health care provider, if necessary.  Regular physical exercise is one of the most important things you can do for your health. Most adults should get at least 150 minutes of moderate-intensity exercise (any activity that increases your heart rate and causes you to sweat) each week. In addition, most adults need muscle-strengthening exercises on 2 or more days a week.  Maintain a healthy weight. The body mass index (BMI) is a screening tool to identify possible weight problems. It provides an estimate of body fat based on height and weight. Your health care provider can find your BMI and can help you achieve or maintain a healthy weight.For adults 20 years and older:  A BMI below 18.5 is considered underweight.  A BMI of 18.5 to 24.9 is normal.  A BMI of 25 to 29.9 is considered  overweight.  A BMI of 30 and above is considered obese.  Maintain normal blood lipids and cholesterol levels by exercising and minimizing your intake of saturated fat. Eat a balanced diet with plenty of fruit and vegetables. Blood tests for lipids and cholesterol should begin at age 64 and be repeated every 5 years. If your lipid or cholesterol levels are high, you are over 50, or you are at high risk for heart disease, you may need your cholesterol levels checked more frequently.Ongoing high lipid and cholesterol levels should be treated with medicines if diet and exercise are not working.  If you smoke, find out from your health care provider how to quit. If you do not use tobacco, do not start.  Lung cancer screening is recommended for adults aged 52-80 years who are at high risk for developing lung cancer because of a history of smoking. A yearly low-dose CT scan of the lungs is recommended for people who have at least a 30-pack-year history of smoking and are a current smoker or have quit within the past 15 years. A pack year of smoking is smoking an average of 1 pack of cigarettes a day for 1 year (for example: 1 pack a day for 30 years or 2 packs a day for 15 years). Yearly screening should continue until the smoker has stopped smoking for at least 15 years. Yearly screening should be stopped for people who develop a health problem that would prevent them from having lung cancer treatment.  If you are pregnant, do not drink alcohol. If you are  breastfeeding, be very cautious about drinking alcohol. If you are not pregnant and choose to drink alcohol, do not have more than 1 drink per day. One drink is considered to be 12 ounces (355 mL) of beer, 5 ounces (148 mL) of wine, or 1.5 ounces (44 mL) of liquor.  Avoid use of street drugs. Do not share needles with anyone. Ask for help if you need support or instructions about stopping the use of drugs.  High blood pressure causes heart disease and  increases the risk of stroke. Your blood pressure should be checked at least every 1 to 2 years. Ongoing high blood pressure should be treated with medicines if weight loss and exercise do not work.  If you are 25-78 years old, ask your health care provider if you should take aspirin to prevent strokes.  Diabetes screening is done by taking a blood sample to check your blood glucose level after you have not eaten for a certain period of time (fasting). If you are not overweight and you do not have risk factors for diabetes, you should be screened once every 3 years starting at age 86. If you are overweight or obese and you are 3-87 years of age, you should be screened for diabetes every year as part of your cardiovascular risk assessment.  Breast cancer screening is essential preventive care for women. You should practice "breast self-awareness." This means understanding the normal appearance and feel of your breasts and may include breast self-examination. Any changes detected, no matter how small, should be reported to a health care provider. Women in their 66s and 30s should have a clinical breast exam (CBE) by a health care provider as part of a regular health exam every 1 to 3 years. After age 43, women should have a CBE every year. Starting at age 37, women should consider having a mammogram (breast X-ray test) every year. Women who have a family history of breast cancer should talk to their health care provider about genetic screening. Women at a high risk of breast cancer should talk to their health care providers about having an MRI and a mammogram every year.  Breast cancer gene (BRCA)-related cancer risk assessment is recommended for women who have family members with BRCA-related cancers. BRCA-related cancers include breast, ovarian, tubal, and peritoneal cancers. Having family members with these cancers may be associated with an increased risk for harmful changes (mutations) in the breast  cancer genes BRCA1 and BRCA2. Results of the assessment will determine the need for genetic counseling and BRCA1 and BRCA2 testing.  Your health care provider may recommend that you be screened regularly for cancer of the pelvic organs (ovaries, uterus, and vagina). This screening involves a pelvic examination, including checking for microscopic changes to the surface of your cervix (Pap test). You may be encouraged to have this screening done every 3 years, beginning at age 78.  For women ages 79-65, health care providers may recommend pelvic exams and Pap testing every 3 years, or they may recommend the Pap and pelvic exam, combined with testing for human papilloma virus (HPV), every 5 years. Some types of HPV increase your risk of cervical cancer. Testing for HPV may also be done on women of any age with unclear Pap test results.  Other health care providers may not recommend any screening for nonpregnant women who are considered low risk for pelvic cancer and who do not have symptoms. Ask your health care provider if a screening pelvic exam is right for  you.  If you have had past treatment for cervical cancer or a condition that could lead to cancer, you need Pap tests and screening for cancer for at least 20 years after your treatment. If Pap tests have been discontinued, your risk factors (such as having a new sexual partner) need to be reassessed to determine if screening should resume. Some women have medical problems that increase the chance of getting cervical cancer. In these cases, your health care provider may recommend more frequent screening and Pap tests.  Colorectal cancer can be detected and often prevented. Most routine colorectal cancer screening begins at the age of 50 years and continues through age 75 years. However, your health care provider may recommend screening at an earlier age if you have risk factors for colon cancer. On a yearly basis, your health care provider may provide  home test kits to check for hidden blood in the stool. Use of a small camera at the end of a tube, to directly examine the colon (sigmoidoscopy or colonoscopy), can detect the earliest forms of colorectal cancer. Talk to your health care provider about this at age 50, when routine screening begins. Direct exam of the colon should be repeated every 5-10 years through age 75 years, unless early forms of precancerous polyps or small growths are found.  People who are at an increased risk for hepatitis B should be screened for this virus. You are considered at high risk for hepatitis B if:  You were born in a country where hepatitis B occurs often. Talk with your health care provider about which countries are considered high risk.  Your parents were born in a high-risk country and you have not received a shot to protect against hepatitis B (hepatitis B vaccine).  You have HIV or AIDS.  You use needles to inject street drugs.  You live with, or have sex with, someone who has hepatitis B.  You get hemodialysis treatment.  You take certain medicines for conditions like cancer, organ transplantation, and autoimmune conditions.  Hepatitis C blood testing is recommended for all people born from 1945 through 1965 and any individual with known risks for hepatitis C.  Practice safe sex. Use condoms and avoid high-risk sexual practices to reduce the spread of sexually transmitted infections (STIs). STIs include gonorrhea, chlamydia, syphilis, trichomonas, herpes, HPV, and human immunodeficiency virus (HIV). Herpes, HIV, and HPV are viral illnesses that have no cure. They can result in disability, cancer, and death.  You should be screened for sexually transmitted illnesses (STIs) including gonorrhea and chlamydia if:  You are sexually active and are younger than 24 years.  You are older than 24 years and your health care provider tells you that you are at risk for this type of infection.  Your sexual  activity has changed since you were last screened and you are at an increased risk for chlamydia or gonorrhea. Ask your health care provider if you are at risk.  If you are at risk of being infected with HIV, it is recommended that you take a prescription medicine daily to prevent HIV infection. This is called preexposure prophylaxis (PrEP). You are considered at risk if:  You are sexually active and do not regularly use condoms or know the HIV status of your partner(s).  You take drugs by injection.  You are sexually active with a partner who has HIV.  Talk with your health care provider about whether you are at high risk of being infected with HIV. If   you choose to begin PrEP, you should first be tested for HIV. You should then be tested every 3 months for as long as you are taking PrEP.  Osteoporosis is a disease in which the bones lose minerals and strength with aging. This can result in serious bone fractures or breaks. The risk of osteoporosis can be identified using a bone density scan. Women ages 1 years and over and women at risk for fractures or osteoporosis should discuss screening with their health care providers. Ask your health care provider whether you should take a calcium supplement or vitamin D to reduce the rate of osteoporosis.  Menopause can be associated with physical symptoms and risks. Hormone replacement therapy is available to decrease symptoms and risks. You should talk to your health care provider about whether hormone replacement therapy is right for you.  Use sunscreen. Apply sunscreen liberally and repeatedly throughout the day. You should seek shade when your shadow is shorter than you. Protect yourself by wearing long sleeves, pants, a wide-brimmed hat, and sunglasses year round, whenever you are outdoors.  Once a month, do a whole body skin exam, using a mirror to look at the skin on your back. Tell your health care provider of new moles, moles that have irregular  borders, moles that are larger than a pencil eraser, or moles that have changed in shape or color.  Stay current with required vaccines (immunizations).  Influenza vaccine. All adults should be immunized every year.  Tetanus, diphtheria, and acellular pertussis (Td, Tdap) vaccine. Pregnant women should receive 1 dose of Tdap vaccine during each pregnancy. The dose should be obtained regardless of the length of time since the last dose. Immunization is preferred during the 27th-36th week of gestation. An adult who has not previously received Tdap or who does not know her vaccine status should receive 1 dose of Tdap. This initial dose should be followed by tetanus and diphtheria toxoids (Td) booster doses every 10 years. Adults with an unknown or incomplete history of completing a 3-dose immunization series with Td-containing vaccines should begin or complete a primary immunization series including a Tdap dose. Adults should receive a Td booster every 10 years.  Varicella vaccine. An adult without evidence of immunity to varicella should receive 2 doses or a second dose if she has previously received 1 dose. Pregnant females who do not have evidence of immunity should receive the first dose after pregnancy. This first dose should be obtained before leaving the health care facility. The second dose should be obtained 4-8 weeks after the first dose.  Human papillomavirus (HPV) vaccine. Females aged 13-26 years who have not received the vaccine previously should obtain the 3-dose series. The vaccine is not recommended for use in pregnant females. However, pregnancy testing is not needed before receiving a dose. If a female is found to be pregnant after receiving a dose, no treatment is needed. In that case, the remaining doses should be delayed until after the pregnancy. Immunization is recommended for any person with an immunocompromised condition through the age of 24 years if she did not get any or all doses  earlier. During the 3-dose series, the second dose should be obtained 4-8 weeks after the first dose. The third dose should be obtained 24 weeks after the first dose and 16 weeks after the second dose.  Zoster vaccine. One dose is recommended for adults aged 97 years or older unless certain conditions are present.  Measles, mumps, and rubella (MMR) vaccine. Adults born  before 1957 generally are considered immune to measles and mumps. Adults born in [birth year] or later should have 1 or more doses of MMR vaccine unless there is a contraindication to the vaccine or there is laboratory evidence of immunity to each of the three diseases. A routine second dose of MMR vaccine should be obtained at least 28 days after the first dose for students attending postsecondary schools, health care workers, or international travelers. People who received inactivated measles vaccine or an unknown type of measles vaccine during 1963-1967 should receive 2 doses of MMR vaccine. People who received inactivated mumps vaccine or an unknown type of mumps vaccine before 1979 and are at high risk for mumps infection should consider immunization with 2 doses of MMR vaccine. For females of childbearing age, rubella immunity should be determined. If there is no evidence of immunity, females who are not pregnant should be vaccinated. If there is no evidence of immunity, females who are pregnant should delay immunization until after pregnancy. Unvaccinated health care workers born before 60 who lack laboratory evidence of measles, mumps, or rubella immunity or laboratory confirmation of disease should consider measles and mumps immunization with 2 doses of MMR vaccine or rubella immunization with 1 dose of MMR vaccine.  Pneumococcal 13-valent conjugate (PCV13) vaccine. When indicated, a person who is uncertain of his immunization history and has no record of immunization should receive the PCV13 vaccine. All adults 61 years of age and older  should receive this vaccine. An adult aged 92 years or older who has certain medical conditions and has not been previously immunized should receive 1 dose of PCV13 vaccine. This PCV13 should be followed with a dose of pneumococcal polysaccharide (PPSV23) vaccine. Adults who are at high risk for pneumococcal disease should obtain the PPSV23 vaccine at least 8 weeks after the dose of PCV13 vaccine. Adults older than 35 years of age who have normal immune system function should obtain the PPSV23 vaccine dose at least 1 year after the dose of PCV13 vaccine.  Pneumococcal polysaccharide (PPSV23) vaccine. When PCV13 is also indicated, PCV13 should be obtained first. All adults aged 2 years and older should be immunized. An adult younger than age 30 years who has certain medical conditions should be immunized. Any person who resides in a nursing home or long-term care facility should be immunized. An adult smoker should be immunized. People with an immunocompromised condition and certain other conditions should receive both PCV13 and PPSV23 vaccines. People with human immunodeficiency virus (HIV) infection should be immunized as soon as possible after diagnosis. Immunization during chemotherapy or radiation therapy should be avoided. Routine use of PPSV23 vaccine is not recommended for American Indians, [place] Natives, or people younger than 65 years unless there are medical conditions that require PPSV23 vaccine. When indicated, people who have unknown immunization and have no record of immunization should receive PPSV23 vaccine. One-time revaccination 5 years after the first dose of PPSV23 is recommended for people aged 19-64 years who have chronic kidney failure, nephrotic syndrome, asplenia, or immunocompromised conditions. People who received 1-2 doses of PPSV23 before age 44 years should receive another dose of PPSV23 vaccine at age 83 years or later if at least 5 years have passed since the previous dose. Doses  of PPSV23 are not needed for people immunized with PPSV23 at or after age 20 years.  Meningococcal vaccine. Adults with asplenia or persistent complement component deficiencies should receive 2 doses of quadrivalent meningococcal conjugate (MenACWY-D) vaccine. The doses should be obtained  at least 2 months apart. Microbiologists working with certain meningococcal bacteria, [place] recruits, people at risk during an outbreak, and people who travel to or live in countries with a high rate of meningitis should be immunized. A first-year college student up through age 28 years who is living in a residence hall should receive a dose if she did not receive a dose on or after her 16th birthday. Adults who have certain high-risk conditions should receive one or more doses of vaccine.  Hepatitis A vaccine. Adults who wish to be protected from this disease, have certain high-risk conditions, work with hepatitis A-infected animals, work in hepatitis A research labs, or travel to or work in countries with a high rate of hepatitis A should be immunized. Adults who were previously unvaccinated and who anticipate close contact with an international adoptee during the first 60 days after arrival in the [place] from a country with a high rate of hepatitis A should be immunized.  Hepatitis B vaccine. Adults who wish to be protected from this disease, have certain high-risk conditions, may be exposed to blood or other infectious body fluids, are household contacts or sex partners of hepatitis B positive people, are clients or workers in certain care facilities, or travel to or work in countries with a high rate of hepatitis B should be immunized.  Haemophilus influenzae type b (Hib) vaccine. A previously unvaccinated person with asplenia or sickle cell disease or having a scheduled splenectomy should receive 1 dose of Hib vaccine. Regardless of previous immunization, a recipient of a hematopoietic stem cell transplant  should receive a 3-dose series 6-12 months after her successful transplant. Hib vaccine is not recommended for adults with HIV infection. Preventive Services / Frequency Ages 71 to 87 years  Blood pressure check.** / Every 3-5 years.  Lipid and cholesterol check.** / Every 5 years beginning at age 1.  Clinical breast exam.** / Every 3 years for women in their 3s and 31s.  BRCA-related cancer risk assessment.** / For women who have family members with a BRCA-related cancer (breast, ovarian, tubal, or peritoneal cancers).  Pap test.** / Every 2 years from ages 50 through 86. Every 3 years starting at age 87 through age 7 or 75 with a history of 3 consecutive normal Pap tests.  HPV screening.** / Every 3 years from ages 59 through ages 35 to 6 with a history of 3 consecutive normal Pap tests.  Hepatitis C blood test.** / For any individual with known risks for hepatitis C.  Skin self-exam. / Monthly.  Influenza vaccine. / Every year.  Tetanus, diphtheria, and acellular pertussis (Tdap, Td) vaccine.** / Consult your health care provider. Pregnant women should receive 1 dose of Tdap vaccine during each pregnancy. 1 dose of Td every 10 years.  Varicella vaccine.** / Consult your health care provider. Pregnant females who do not have evidence of immunity should receive the first dose after pregnancy.  HPV vaccine. / 3 doses over 6 months, if 72 and younger. The vaccine is not recommended for use in pregnant females. However, pregnancy testing is not needed before receiving a dose.  Measles, mumps, rubella (MMR) vaccine.** / You need at least 1 dose of MMR if you were born in 1957 or later. You may also need a 2nd dose. For females of childbearing age, rubella immunity should be determined. If there is no evidence of immunity, females who are not pregnant should be vaccinated. If there is no evidence of immunity, females who are  pregnant should delay immunization until after  pregnancy.  Pneumococcal 13-valent conjugate (PCV13) vaccine.** / Consult your health care provider.  Pneumococcal polysaccharide (PPSV23) vaccine.** / 1 to 2 doses if you smoke cigarettes or if you have certain conditions.  Meningococcal vaccine.** / 1 dose if you are age 87 to 44 years and a Market researcher living in a residence hall, or have one of several medical conditions, you need to get vaccinated against meningococcal disease. You may also need additional booster doses.  Hepatitis A vaccine.** / Consult your health care provider.  Hepatitis B vaccine.** / Consult your health care provider.  Haemophilus influenzae type b (Hib) vaccine.** / Consult your health care provider. Ages 86 to 38 years  Blood pressure check.** / Every year.  Lipid and cholesterol check.** / Every 5 years beginning at age 49 years.  Lung cancer screening. / Every year if you are aged 71-80 years and have a 30-pack-year history of smoking and currently smoke or have quit within the past 15 years. Yearly screening is stopped once you have quit smoking for at least 15 years or develop a health problem that would prevent you from having lung cancer treatment.  Clinical breast exam.** / Every year after age 51 years.  BRCA-related cancer risk assessment.** / For women who have family members with a BRCA-related cancer (breast, ovarian, tubal, or peritoneal cancers).  Mammogram.** / Every year beginning at age 18 years and continuing for as long as you are in good health. Consult with your health care provider.  Pap test.** / Every 3 years starting at age 63 years through age 37 or 57 years with a history of 3 consecutive normal Pap tests.  HPV screening.** / Every 3 years from ages 41 years through ages 76 to 23 years with a history of 3 consecutive normal Pap tests.  Fecal occult blood test (FOBT) of stool. / Every year beginning at age 36 years and continuing until age 51 years. You may not need  to do this test if you get a colonoscopy every 10 years.  Flexible sigmoidoscopy or colonoscopy.** / Every 5 years for a flexible sigmoidoscopy or every 10 years for a colonoscopy beginning at age 36 years and continuing until age 35 years.  Hepatitis C blood test.** / For all people born from [birth year] through 1965 and any individual with known risks for hepatitis C.  Skin self-exam. / Monthly.  Influenza vaccine. / Every year.  Tetanus, diphtheria, and acellular pertussis (Tdap/Td) vaccine.** / Consult your health care provider. Pregnant women should receive 1 dose of Tdap vaccine during each pregnancy. 1 dose of Td every 10 years.  Varicella vaccine.** / Consult your health care provider. Pregnant females who do not have evidence of immunity should receive the first dose after pregnancy.  Zoster vaccine.** / 1 dose for adults aged 73 years or older.  Measles, mumps, rubella (MMR) vaccine.** / You need at least 1 dose of MMR if you were born in 1957 or later. You may also need a second dose. For females of childbearing age, rubella immunity should be determined. If there is no evidence of immunity, females who are not pregnant should be vaccinated. If there is no evidence of immunity, females who are pregnant should delay immunization until after pregnancy.  Pneumococcal 13-valent conjugate (PCV13) vaccine.** / Consult your health care provider.  Pneumococcal polysaccharide (PPSV23) vaccine.** / 1 to 2 doses if you smoke cigarettes or if you have certain conditions.  Meningococcal vaccine.** /  Consult your health care provider.  Hepatitis A vaccine.** / Consult your health care provider.  Hepatitis B vaccine.** / Consult your health care provider.  Haemophilus influenzae type b (Hib) vaccine.** / Consult your health care provider. Ages 80 years and over  Blood pressure check.** / Every year.  Lipid and cholesterol check.** / Every 5 years beginning at age 62 years.  Lung cancer  screening. / Every year if you are aged 32-80 years and have a 30-pack-year history of smoking and currently smoke or have quit within the past 15 years. Yearly screening is stopped once you have quit smoking for at least 15 years or develop a health problem that would prevent you from having lung cancer treatment.  Clinical breast exam.** / Every year after age 61 years.  BRCA-related cancer risk assessment.** / For women who have family members with a BRCA-related cancer (breast, ovarian, tubal, or peritoneal cancers).  Mammogram.** / Every year beginning at age 39 years and continuing for as long as you are in good health. Consult with your health care provider.  Pap test.** / Every 3 years starting at age 85 years through age 74 or 72 years with 3 consecutive normal Pap tests. Testing can be stopped between 65 and 70 years with 3 consecutive normal Pap tests and no abnormal Pap or HPV tests in the past 10 years.  HPV screening.** / Every 3 years from ages 55 years through ages 67 or 77 years with a history of 3 consecutive normal Pap tests. Testing can be stopped between 65 and 70 years with 3 consecutive normal Pap tests and no abnormal Pap or HPV tests in the past 10 years.  Fecal occult blood test (FOBT) of stool. / Every year beginning at age 81 years and continuing until age 22 years. You may not need to do this test if you get a colonoscopy every 10 years.  Flexible sigmoidoscopy or colonoscopy.** / Every 5 years for a flexible sigmoidoscopy or every 10 years for a colonoscopy beginning at age 67 years and continuing until age 22 years.  Hepatitis C blood test.** / For all people born from [birth year] through 1965 and any individual with known risks for hepatitis C.  Osteoporosis screening.** / A one-time screening for women ages 8 years and over and women at risk for fractures or osteoporosis.  Skin self-exam. / Monthly.  Influenza vaccine. / Every year.  Tetanus, diphtheria, and  acellular pertussis (Tdap/Td) vaccine.** / 1 dose of Td every 10 years.  Varicella vaccine.** / Consult your health care provider.  Zoster vaccine.** / 1 dose for adults aged 56 years or older.  Pneumococcal 13-valent conjugate (PCV13) vaccine.** / Consult your health care provider.  Pneumococcal polysaccharide (PPSV23) vaccine.** / 1 dose for all adults aged 15 years and older.  Meningococcal vaccine.** / Consult your health care provider.  Hepatitis A vaccine.** / Consult your health care provider.  Hepatitis B vaccine.** / Consult your health care provider.  Haemophilus influenzae type b (Hib) vaccine.** / Consult your health care provider. ** Family history and personal history of risk and conditions may change your health care provider's recommendations.   This information is not intended to replace advice given to you by your health care provider. Make sure you discuss any questions you have with your health care provider.   Document Released: 03/06/2001 Document Revised: 01/29/2014 Document Reviewed: 06/05/2010 Elsevier Interactive Patient Education [employer].

## 2015-10-14 LAB — COMPREHENSIVE METABOLIC PANEL
A/G RATIO: 1.4 (ref 1.2–2.2)
ALBUMIN: 4.2 g/dL (ref 3.5–5.5)
ALK PHOS: 63 IU/L (ref 39–117)
ALT: 14 IU/L (ref 0–32)
AST: 13 IU/L (ref 0–40)
BILIRUBIN TOTAL: 0.4 mg/dL (ref 0.0–1.2)
BUN / CREAT RATIO: 13 (ref 9–23)
BUN: 12 mg/dL (ref 6–20)
CHLORIDE: 102 mmol/L (ref 96–106)
CO2: 20 mmol/L (ref 18–29)
Calcium: 8.9 mg/dL (ref 8.7–10.2)
Creatinine, Ser: 0.9 mg/dL (ref 0.57–1.00)
GFR calc Af Amer: 96 mL/min/{1.73_m2} (ref >59)
GFR calc non Af Amer: 84 mL/min/{1.73_m2} (ref >59)
GLOBULIN, TOTAL: 2.9 g/dL (ref 1.5–4.5)
GLUCOSE: 91 mg/dL (ref 65–99)
POTASSIUM: 4 mmol/L (ref 3.5–5.2)
SODIUM: 141 mmol/L (ref 134–144)
Total Protein: 7.1 g/dL (ref 6.0–8.5)

## 2015-10-14 LAB — LIPID PANEL
Chol/HDL Ratio: 3.7 ratio units (ref 0.0–4.4)
Cholesterol, Total: 187 mg/dL (ref 100–199)
HDL: 50 mg/dL (ref >39)
LDL Calculated: 117 mg/dL — ABNORMAL HIGH (ref 0–99)
TRIGLYCERIDES: 102 mg/dL (ref 0–149)
VLDL CHOLESTEROL CAL: 20 mg/dL (ref 5–40)

## 2015-10-14 LAB — HEMOGLOBIN A1C
Est. average glucose Bld gHb Est-mCnc: 100 mg/dL
HEMOGLOBIN A1C: 5.1 % (ref 4.8–5.6)

## 2015-10-17 LAB — CYTOLOGY - PAP

## 2015-10-20 ENCOUNTER — Ambulatory Visit: Payer: BLUE CROSS/BLUE SHIELD

## 2015-11-16 ENCOUNTER — Ambulatory Visit (INDEPENDENT_AMBULATORY_CARE_PROVIDER_SITE_OTHER): Payer: BLUE CROSS/BLUE SHIELD

## 2015-11-16 VITALS — BP 116/85 | HR 88 | Ht 70.0 in | Wt 235.6 lb

## 2015-11-16 DIAGNOSIS — Z6833 Body mass index (BMI) 33.0-33.9, adult: Secondary | ICD-10-CM

## 2015-11-16 DIAGNOSIS — E669 Obesity, unspecified: Secondary | ICD-10-CM | POA: Diagnosis not present

## 2015-11-16 MED ORDER — CYANOCOBALAMIN 1000 MCG/ML IJ SOLN
1000.0000 ug | Freq: Once | INTRAMUSCULAR | Status: AC
  Administered 2015-11-16: 1000 ug via INTRAMUSCULAR

## 2015-11-16 NOTE — Progress Notes (Signed)
Pt presents for weight, B/P, B-12 injection. No side effects of medication-Phentermine, or B-12.  Weight loss of _5_ lbs. Encouraged eating healthy and exercise.

## 2015-11-17 ENCOUNTER — Ambulatory Visit: Payer: BLUE CROSS/BLUE SHIELD

## 2015-12-14 ENCOUNTER — Ambulatory Visit: Payer: BLUE CROSS/BLUE SHIELD

## 2015-12-19 ENCOUNTER — Ambulatory Visit (INDEPENDENT_AMBULATORY_CARE_PROVIDER_SITE_OTHER): Payer: BLUE CROSS/BLUE SHIELD | Admitting: Obstetrics and Gynecology

## 2015-12-19 VITALS — BP 123/79 | HR 91 | Ht 70.0 in | Wt 228.0 lb

## 2015-12-19 DIAGNOSIS — E669 Obesity, unspecified: Secondary | ICD-10-CM

## 2015-12-19 DIAGNOSIS — Z6833 Body mass index (BMI) 33.0-33.9, adult: Secondary | ICD-10-CM | POA: Diagnosis not present

## 2015-12-19 MED ORDER — CYANOCOBALAMIN 1000 MCG/ML IJ SOLN
1000.0000 ug | Freq: Once | INTRAMUSCULAR | Status: AC
  Administered 2015-12-19: 1000 ug via INTRAMUSCULAR

## 2015-12-27 ENCOUNTER — Other Ambulatory Visit: Payer: Self-pay | Admitting: Obstetrics and Gynecology

## 2016-01-25 ENCOUNTER — Ambulatory Visit (INDEPENDENT_AMBULATORY_CARE_PROVIDER_SITE_OTHER): Payer: BLUE CROSS/BLUE SHIELD | Admitting: Obstetrics and Gynecology

## 2016-01-25 ENCOUNTER — Encounter: Payer: Self-pay | Admitting: Obstetrics and Gynecology

## 2016-01-25 VITALS — BP 125/81 | HR 113 | Ht 70.0 in | Wt 230.3 lb

## 2016-01-25 DIAGNOSIS — Z79899 Other long term (current) drug therapy: Secondary | ICD-10-CM | POA: Diagnosis not present

## 2016-01-25 DIAGNOSIS — E66811 Obesity, class 1: Secondary | ICD-10-CM

## 2016-01-25 DIAGNOSIS — E669 Obesity, unspecified: Secondary | ICD-10-CM

## 2016-01-25 MED ORDER — CYANOCOBALAMIN 1000 MCG/ML IJ SOLN: 1000.0000 ug | INTRAMUSCULAR | 1 refills | Status: DC

## 2016-01-25 MED ORDER — PHENTERMINE HCL 37.5 MG PO TABS: 37.5000 mg | ORAL_TABLET | Freq: Every day | ORAL | 2 refills | Status: DC

## 2016-01-25 NOTE — Progress Notes (Signed)
Pt is here for a followup on weight management.No reported side effects.

## 2016-01-25 NOTE — Progress Notes (Signed)
SUBJECTIVE:  36 y.o. here for follow-up weight loss visit, previously seen 4 weeks ago. Denies any concerns and feels like medication is working well. Is currently not exercising, but states good water intake.  Desires another round.  OBJECTIVE:  BP 125/81   Pulse (!) 113   Ht 5\' 10"  (1.778 m)   Wt 230 lb 5 oz (104.5 kg)   LMP 01/24/2016 (Exact Date)   BMI 33.05 kg/m   Body mass index is 33.05 kg/m. Patient appears well. ASSESSMENT:  Obesity- responding well to weight loss plan PLAN:  To continue with current medications. B12 1054mcg/ml injection given RTC in 4 weeks as planned  Melody Diamond, CNM

## 2016-03-07 ENCOUNTER — Ambulatory Visit (INDEPENDENT_AMBULATORY_CARE_PROVIDER_SITE_OTHER): Payer: BLUE CROSS/BLUE SHIELD | Admitting: Obstetrics and Gynecology

## 2016-03-07 VITALS — BP 111/77 | HR 75 | Ht 70.0 in | Wt 224.9 lb

## 2016-03-07 DIAGNOSIS — E669 Obesity, unspecified: Secondary | ICD-10-CM

## 2016-03-07 MED ORDER — CYANOCOBALAMIN 1000 MCG/ML IJ SOLN
1000.0000 ug | Freq: Once | INTRAMUSCULAR | Status: AC
  Administered 2016-03-07: 1000 ug via INTRAMUSCULAR

## 2016-03-07 NOTE — Progress Notes (Signed)
[name], female   DOB: October 23, 1980, 37 y.o.   MRN: [id number] Pt presents for weight, B/P, B-12 injection. No side effects of medication-Phentermine, or B-12.  Weight loss of  6  lbs. Encouraged eating healthy and exercise.

## 2016-04-01 ENCOUNTER — Other Ambulatory Visit: Payer: Self-pay | Admitting: Obstetrics and Gynecology

## 2016-04-02 ENCOUNTER — Other Ambulatory Visit: Payer: Self-pay | Admitting: Medical

## 2016-04-02 ENCOUNTER — Telehealth: Payer: Self-pay | Admitting: Medical

## 2016-04-02 NOTE — Telephone Encounter (Signed)
[place] emailed asking about the referall for the dermatologist that was requested at her last view. She didn't know if she need to call or not. 2 question that Mayetta emailed was if you could refill a prescription for her from her neurologist. She takes 30 mgs daily for Nortiptyline for about a year and a half to prevent regular headaches. Her prescription has run out and he is booked for a while and I am beginning to get headaches regularly again. FYI I send the email copy to your email address. Copy Jenny Reichmann and Pamala Hurry on it too.

## 2016-04-03 ENCOUNTER — Ambulatory Visit: Payer: BLUE CROSS/BLUE SHIELD | Admitting: Obstetrics and Gynecology

## 2016-04-12 ENCOUNTER — Ambulatory Visit (INDEPENDENT_AMBULATORY_CARE_PROVIDER_SITE_OTHER): Payer: BLUE CROSS/BLUE SHIELD | Admitting: Obstetrics and Gynecology

## 2016-04-12 VITALS — BP 119/76 | HR 108 | Ht 70.0 in | Wt 226.2 lb

## 2016-04-12 DIAGNOSIS — E669 Obesity, unspecified: Secondary | ICD-10-CM

## 2016-04-12 MED ORDER — CYANOCOBALAMIN 1000 MCG/ML IJ SOLN
1000.0000 ug | Freq: Once | INTRAMUSCULAR | Status: AC
  Administered 2016-04-12: 1000 ug via INTRAMUSCULAR

## 2016-04-12 NOTE — Progress Notes (Signed)
Pt presents for wt, bp, and b12 inj. Wt is down 4#. No s/e noted. Per pt request she would like to f/u in 5 weeks instead of 4. Doesn't want to be weighed before her menses. Interested in having her nortriptyline refilled. Uses this for h/a. Last rxed by neurologist. Pt is having a hard time getting an appt with him d/t her schedule. Advised her to mention this to [facility] at [facility]. Pt will f/u in 5 weeks with mns.

## 2016-04-24 ENCOUNTER — Encounter: Payer: Self-pay | Admitting: Medical

## 2016-05-04 ENCOUNTER — Telehealth: Payer: Self-pay | Admitting: General Surgery

## 2016-05-04 NOTE — Telephone Encounter (Signed)
L/M FOR PT TO CALL THE OFFICE TO SCHEDULE AN APPOINTMENT WITH DR BYRNETT.LAST SEEN 04-01-14.REFERRAL CAME THROUGH FAX FROM BRENDA KAY SANDRIDGE PA([facility]/DR DASHER EVAL LESSION LEFT POSTERIOR THIGH & MULTIPLE OTHER SMALLER LIPOMAS ON ARMS & LEGS.(RECORDS  ON MELANIE'S DESK)/MTH

## 2016-05-08 NOTE — Telephone Encounter (Signed)
05-08-16 @ 8:48 AM I LEFT MESSAGE FOR PATIENT TO RETURN CALL TO SCHEDULE APPOINTMENT.PATIENT'S WORK # IS OUT OF SERVICE.

## 2016-05-14 ENCOUNTER — Telehealth: Payer: Self-pay | Admitting: General Surgery

## 2016-05-14 NOTE — Telephone Encounter (Signed)
I CALLED PATIENT'S MOTHER LINDA EDWARDS ASKING HER TO HAVE THE PATIENT CONTACT THE OFFICE.

## 2016-05-15 ENCOUNTER — Encounter: Payer: Self-pay | Admitting: *Deleted

## 2016-05-15 ENCOUNTER — Ambulatory Visit: Payer: BLUE CROSS/BLUE SHIELD | Admitting: Medical

## 2016-05-15 VITALS — BP 100/70 | HR 108 | Temp 99.0°F | Resp 16 | Ht 70.0 in | Wt 220.0 lb

## 2016-05-15 DIAGNOSIS — L089 Local infection of the skin and subcutaneous tissue, unspecified: Secondary | ICD-10-CM

## 2016-05-15 MED ORDER — AMOXICILLIN-POT CLAVULANATE 875-125 MG PO TABS: 1.0000 | ORAL_TABLET | Freq: Two times a day (BID) | ORAL | 0 refills | Status: DC

## 2016-05-15 NOTE — Progress Notes (Signed)
   Subjective:    [name], female    DOB: [birth year], 36 y.o.   MRN: [id number]  HPI 36 yo right eye swelling yesterday noticed it when she woke up. Last night nausea and vomited 1 time then felt better. Took ibuprofen 200 mg x 2 tablets at  9:45 this morning. Also noticed face swelling, glasses on face seemed and felt uncomfortable. Then also noticed a swollen knot at the preauricular lymph node. She thinks some blurry vision out of the right eye. Eye is swollen lower lid.  Left eye tearing some. Saturday did yard work.      Review of Systems  Constitutional: Negative for chills and fever.  HENT: Negative.  Negative for congestion.   Eyes: Positive for pain, discharge, redness and itching. Negative for photophobia.  Respiratory: Negative for cough and shortness of breath.   Cardiovascular: Negative for chest pain.  Gastrointestinal: Positive for nausea and vomiting. Negative for diarrhea.  Endocrine: Negative for cold intolerance and heat intolerance.  Genitourinary: Negative for dysuria and frequency.  Musculoskeletal: Positive for back pain. Negative for joint swelling.  Skin: Negative for color change and rash.  Neurological: Negative for dizziness, syncope and light-headedness.  Hematological: Positive for adenopathy.  Psychiatric/Behavioral: Negative for confusion and hallucinations.   Taking advil for back spasms. Right  Eye 20/30 -2 Left eye 20/40-1    Objective:   Physical Exam  Constitutional: She appears well-developed and well-nourished.  HENT:  Head: Normocephalic and atraumatic.  Right Ear: External ear normal.  Left Ear: External ear normal.  Mouth/Throat: Oropharynx is clear and moist.  Eyes: Conjunctivae and EOM are normal. Pupils are equal, round, and reactive to light. Right eye exhibits hordeolum. Right eye exhibits no discharge. No scleral icterus.  Nursing note and vitals reviewed.   Preauricular node on the right side swollen and  tender. Redness noted under right eye, with potential hordeolum on the lateral side of the lid. Sclera within normal limits bilaterally.     Assessment & Plan:  HR recheck  78 Skin infection with potential hordelumon on  The right  lateral lower lid . or possibly an early cellulitis on the right  lateral lower lid . Augmentin 875 mg one twice daily x  10 days. No refills. Warm and cool compresses throughout the day.  OTC motrin  800mg  every 8 hours with food.  Recheck 24 hours. Appointment with [facility] on Monday her regular check up. To apply warm compresses and alternate with cool compresses on and off the eye every few seconds due to the delicateness of the eye.  Patient verbalizes understanding , no questions at discharge.

## 2016-05-16 ENCOUNTER — Ambulatory Visit: Payer: BLUE CROSS/BLUE SHIELD | Admitting: Medical

## 2016-05-16 VITALS — BP 104/78 | HR 98 | Temp 99.1°F | Resp 16

## 2016-05-16 DIAGNOSIS — L089 Local infection of the skin and subcutaneous tissue, unspecified: Secondary | ICD-10-CM

## 2016-05-16 NOTE — Progress Notes (Signed)
   Subjective:    [name], female    DOB: [birth year], 36 y.o.   MRN: [id number]  HPI 36 yo female returns to clinic for recheck of skin infection lower lid. Went home yesterday and rested, applied warm and cool compresses to the area. Took ibuprofen 800mg  5:30am today. Wants to work today, her pain is better under control.     Review of Systems  Constitutional: Negative for chills and fever.  HENT: Negative.   Eyes: Positive for pain. Negative for photophobia, discharge, redness, itching and visual disturbance.  Respiratory: Negative.   Cardiovascular: Negative.   Gastrointestinal: Negative.   Endocrine: Negative.   Genitourinary: Negative.   Musculoskeletal: Negative.   Neurological: Negative for dizziness, syncope, light-headedness and headaches.  Hematological: Positive for adenopathy.  Pain improved, able to open eye better. Facial swelling decreased     Objective:   Physical Exam  Constitutional: She is oriented to person, place, and time. She appears well-developed and well-nourished.  HENT:  Head: Normocephalic and atraumatic.  Eyes: Conjunctivae and EOM are normal. Pupils are equal, round, and reactive to light. Right eye exhibits no discharge. No scleral icterus.  Neck: Normal range of motion. Neck supple.  Neurological: She is alert and oriented to person, place, and time.  Skin: Skin is warm and dry. There is erythema.  Psychiatric: She has a normal mood and affect. Her behavior is normal.   Right eye more open this am. Swelling on lower lid lateraly reduced, erythema noted but improved from yesterday. Patient was able to get in  2 doses yesterday and one dose today of  Augmentin. Overall facial swelling is also improved. fundascope within normal limits, PERRLA and EOMI.      Assessment & Plan:  Skin infection right eye on Augmenting 875mg  taking twice daily x  10 days. To continue ice and heat to the area as recommended. Continue  800 mg of  Ibuprofen  Every 8 hours, given Tylenol in the clinic 1 gram to help with fever ( 99 degrees in clinic).   Lot [id number] exp 421 Patient feeling better today and wants to work. Return to the clinic as needed.

## 2016-05-17 ENCOUNTER — Encounter: Payer: BLUE CROSS/BLUE SHIELD | Admitting: Obstetrics and Gynecology

## 2016-05-24 ENCOUNTER — Telehealth: Payer: Self-pay | Admitting: General Surgery

## 2016-05-24 ENCOUNTER — Encounter: Payer: Self-pay | Admitting: Obstetrics and Gynecology

## 2016-05-24 ENCOUNTER — Ambulatory Visit (INDEPENDENT_AMBULATORY_CARE_PROVIDER_SITE_OTHER): Payer: BLUE CROSS/BLUE SHIELD | Admitting: Obstetrics and Gynecology

## 2016-05-24 VITALS — BP 121/79 | HR 80 | Ht 70.0 in | Wt 217.3 lb

## 2016-05-24 DIAGNOSIS — E669 Obesity, unspecified: Secondary | ICD-10-CM | POA: Diagnosis not present

## 2016-05-24 DIAGNOSIS — F3281 Premenstrual dysphoric disorder: Secondary | ICD-10-CM

## 2016-05-24 MED ORDER — CYANOCOBALAMIN 1000 MCG/ML IJ SOLN: 1000.0000 ug | INTRAMUSCULAR | 1 refills | Status: DC

## 2016-05-24 MED ORDER — LEVONORGEST-ETH ESTRAD 91-DAY 0.1-0.02 & 0.01 MG PO TABS: 1.0000 | ORAL_TABLET | Freq: Every day | ORAL | 4 refills | Status: DC

## 2016-05-24 MED ORDER — PHENTERMINE HCL 37.5 MG PO TABS: 37.5000 mg | ORAL_TABLET | Freq: Every day | ORAL | 2 refills | Status: DC

## 2016-05-24 NOTE — Progress Notes (Signed)
SUBJECTIVE:  36 y.o. here for follow-up weight loss visit, previously seen 4 weeks ago. Denies any concerns and feels like medication has worked well, is walking 3-4 days, drinking 6-9 bottles a water a day. Has lost 13 more pounds. Has been watching moods and has found a relationship with hormones-feels best in the middle of pack of pills, but anxiety/depression worse with menses and into first few pills.  OBJECTIVE:  BP 121/79   Pulse 80   Ht 5\' 10"  (1.778 m)   Wt 217 lb 4.8 oz (98.6 kg)   LMP 05/15/2016 (Exact Date)   BMI 31.18 kg/m   Body mass index is 31.18 kg/m. Patient appears well.  ASSESSMENT:   Obesity- responding well to weight loss plan PMDD with anxiety  PLAN:  To continue with current medications. B12 1029mcg/ml injection given, will change OCPs to continuous cycling.  RTC in 4 weeks as planned  Melody Isleta, CNM

## 2016-05-24 NOTE — Telephone Encounter (Signed)
Patient called back about her referral from [facility], Leata Mouse PA(Dr Dasher). She is deferring her appointment for now due to financial reasons. She will call back soon to set this up. I let her know that that was fine and that we would keep her referral records for now. Records placed in patient notes folder.

## 2016-05-24 NOTE — Telephone Encounter (Signed)
I TRIED TO LEAVE ANOTHER [place] ON PATIENT'S PHONE BUT [employer] MAIL IS FULL.I LEFT A MESSAGE FOR TRAVY SYKES(SISTER/[phone])ASKING HER TO HAVE HER SISTER TO CALL OUR OFFICE.

## 2016-05-31 ENCOUNTER — Ambulatory Visit: Payer: BLUE CROSS/BLUE SHIELD | Admitting: Medical

## 2016-06-05 ENCOUNTER — Encounter: Payer: Self-pay | Admitting: Medical

## 2016-06-05 ENCOUNTER — Ambulatory Visit: Payer: BLUE CROSS/BLUE SHIELD | Admitting: Medical

## 2016-06-05 VITALS — BP 120/70 | HR 78 | Temp 98.9°F | Resp 16 | Wt 219.0 lb

## 2016-06-05 DIAGNOSIS — M545 Low back pain, unspecified: Secondary | ICD-10-CM

## 2016-06-05 DIAGNOSIS — G8929 Other chronic pain: Secondary | ICD-10-CM

## 2016-06-05 DIAGNOSIS — G5601 Carpal tunnel syndrome, right upper limb: Secondary | ICD-10-CM

## 2016-06-05 MED ORDER — GABAPENTIN 300 MG PO CAPS: ORAL_CAPSULE | ORAL | 0 refills | Status: DC

## 2016-06-05 MED ORDER — CYCLOBENZAPRINE HCL 10 MG PO TABS: 10.0000 mg | ORAL_TABLET | Freq: Three times a day (TID) | ORAL | 3 refills | Status: DC | PRN

## 2016-06-05 NOTE — Progress Notes (Signed)
BP 120/70   Pulse 78   Temp 98.9 F (37.2 C)   Resp 16   Wt 219 lb (99.3 kg)   LMP 05/15/2016 (Exact Date)   BMI 31.42 kg/m    Subjective:    [name], female    DOB: September 06, 1980, 36 y.o.   MRN: [id number]  HPI: Veronica Reynolds is a 36 y.o. female  Chief Complaint  Patient presents with  . Back Pain   Patient presents with worsening chronic muscle spasms and LBP that she reports started in her teenage years and has persisted. Denies hx of curvature defects or back injuries. States the discomfort is b/l lumbar paraspinal area and also upper back b/l. Saw a chiropractor for a year in her late 70s with minimal benefit. Gets regular massages which helps temporarily but muscle tightness and LBP always comes back. Has been working with GYN to lose weight to see if that helps at all. Feels much of her issues come from breast size. Goes between a triple D and an F depending on weight, since d/c of nursing last year has remained at an E cup. Having to wear multiple layers of bras at all times and noticing difficulty over the years with her posture d/t the weight and permanent indentions from weight of straps on her shoulders. Explored breast reduction surgery several years ago but was told it would possible impact nursing a baby. Pt finished nursing and now interested in another discussion about reduction. Taking occasional NSAIDs but no relief - trying to limit use d/t hx of gastritis.   Chronically struggled with carpal tunnel b/l, s/p release of left hand Jan 2017. Took gabapentin with good relief prior to that. Had good relief for months but now coming back on the right, worse in the mornings. Uses braces when issue flares, and has been using a supportive wrist pad with her keyboard at work.   Relevant past medical, surgical, family and social history reviewed and updated as indicated. Interim medical history since our last visit reviewed. Allergies and medications reviewed  and updated.  Review of Systems  Constitutional: Negative.   HENT: Negative.   Eyes: Negative.   Respiratory: Negative.   Cardiovascular: Negative.   Gastrointestinal: Negative.   Genitourinary: Negative.   Musculoskeletal: Positive for back pain.  Neurological: Positive for numbness.  Psychiatric/Behavioral: Negative.     Per HPI unless specifically indicated above     Objective:    BP 120/70   Pulse 78   Temp 98.9 F (37.2 C)   Resp 16   Wt 219 lb (99.3 kg)   LMP 05/15/2016 (Exact Date)   BMI 31.42 kg/m   Wt Readings from Last 3 Encounters:  06/05/16 219 lb (99.3 kg)  05/24/16 217 lb 4.8 oz (98.6 kg)  05/15/16 220 lb (99.8 kg)    Physical Exam  Constitutional: She is oriented to person, place, and time. She appears well-developed and well-nourished. No distress.  HENT:  Head: Atraumatic.  Eyes: Conjunctivae are normal. Pupils are equal, round, and reactive to light.  Neck: Normal range of motion. Neck supple.  Cardiovascular: Normal rate and normal heart sounds.   Pulmonary/Chest: Effort normal and breath sounds normal. No respiratory distress.  Musculoskeletal: Normal range of motion. She exhibits tenderness (b/l lumbar paraspinal muscles and trapezius b/l). She exhibits no edema.  Neurological: She is alert and oriented to person, place, and time.  Skin: Skin is warm and dry.  Psychiatric: She has a normal  mood and affect. Her behavior is normal.  Nursing note and vitals reviewed.     Assessment & Plan:   Problem List Items Addressed This Visit    None    Visit Diagnoses    Chronic bilateral low back pain without sciatica    -  Primary   Referral placed for consult about breast reduction. Unlikely a skeletal cause, suspect her breast size is causing her constant postural and tension issues.    Relevant Medications   cyclobenzaprine (FLEXERIL) 10 MG tablet   Other Relevant Orders   Ambulatory referral to Plastic Surgery   Carpal tunnel syndrome of right  wrist       Restart gabapentin prn, discussed epsom salt soaks, stretches, continue wearing brace on regular basis. Suspect once back issues resolve this will improve   Relevant Medications   cyclobenzaprine (FLEXERIL) 10 MG tablet   gabapentin (NEURONTIN) 300 MG capsule    In meantime, will give flexeril for prn use. Discussed precautions at length, specifically using in conjunction with klonopin as both can be sedating. Pt agreeable to plan. Will start epsom salt soaks, continue massage and core exercises to help strengthen supporting muscles and improve posture.    Follow up plan: Return in about 4 weeks (around 07/03/2016) for Back Pain f/u.

## 2016-06-05 NOTE — Patient Instructions (Signed)
Follow up in 1 month   

## 2016-06-12 ENCOUNTER — Other Ambulatory Visit: Payer: BLUE CROSS/BLUE SHIELD | Admitting: *Deleted

## 2016-06-12 DIAGNOSIS — E559 Vitamin D deficiency, unspecified: Secondary | ICD-10-CM

## 2016-06-13 LAB — VITAMIN D 25 HYDROXY (VIT D DEFICIENCY, FRACTURES): Vit D, 25-Hydroxy: 32.1 ng/mL (ref 30.0–100.0)

## 2016-06-22 ENCOUNTER — Ambulatory Visit: Payer: BLUE CROSS/BLUE SHIELD

## 2016-06-22 ENCOUNTER — Telehealth: Payer: Self-pay

## 2016-06-22 NOTE — Telephone Encounter (Signed)
-----   Message from Gean Quint, RN sent at 06/22/2016 10:30 AM EDT ----- Regarding: RE: Vit D level   ----- Message ----- From: Talmage Nap, PA-C Sent: 06/13/2016   3:58 PM To: Gean Quint, RN Subject: Vit D level                                    Please contact patient and let her know her vitamin D level is low and we recommend  Vit D3  4000 IU/ day with a recheck in one year. Thank you Nira Conn

## 2016-06-29 ENCOUNTER — Ambulatory Visit (INDEPENDENT_AMBULATORY_CARE_PROVIDER_SITE_OTHER): Payer: BLUE CROSS/BLUE SHIELD | Admitting: Obstetrics and Gynecology

## 2016-06-29 VITALS — BP 123/82 | HR 78 | Ht 70.0 in | Wt 216.3 lb

## 2016-06-29 DIAGNOSIS — E669 Obesity, unspecified: Secondary | ICD-10-CM

## 2016-06-29 MED ORDER — CYANOCOBALAMIN 1000 MCG/ML IJ SOLN
1000.0000 ug | Freq: Once | INTRAMUSCULAR | Status: AC
  Administered 2016-06-29: 1000 ug via INTRAMUSCULAR

## 2016-06-29 NOTE — Progress Notes (Signed)
[name], female   DOB: Nov 30, 1980, 36 y.o.   MRN: [id number] Pt presents for weight, B/P, B-12 injection. No side effects of medication-Phentermine, or B-12.  Weight loss of 1 lbs. Encouraged eating healthy and exercise.

## 2016-07-16 ENCOUNTER — Encounter: Payer: Self-pay | Admitting: Medical

## 2016-07-16 ENCOUNTER — Ambulatory Visit: Payer: BLUE CROSS/BLUE SHIELD | Admitting: Medical

## 2016-07-16 VITALS — BP 110/70 | HR 86 | Temp 99.2°F | Resp 16 | Ht 69.0 in | Wt 221.0 lb

## 2016-07-16 DIAGNOSIS — M549 Dorsalgia, unspecified: Secondary | ICD-10-CM

## 2016-07-16 DIAGNOSIS — K2900 Acute gastritis without bleeding: Secondary | ICD-10-CM

## 2016-07-17 ENCOUNTER — Ambulatory Visit: Payer: BLUE CROSS/BLUE SHIELD | Admitting: Medical

## 2016-07-17 ENCOUNTER — Encounter: Payer: Self-pay | Admitting: Medical

## 2016-07-17 VITALS — BP 106/70 | HR 86 | Temp 99.2°F | Resp 16 | Ht 69.0 in | Wt 221.0 lb

## 2016-07-17 DIAGNOSIS — K2900 Acute gastritis without bleeding: Secondary | ICD-10-CM

## 2016-07-17 MED ORDER — OMEPRAZOLE 20 MG PO CPDR: 20.0000 mg | DELAYED_RELEASE_CAPSULE | Freq: Every day | ORAL | 3 refills | Status: DC

## 2016-07-17 NOTE — Progress Notes (Signed)
Patient is here for follow up regarding back pain.  She is taking flexerill QHS just to be able to rest.  Continues with upper back/shoulder, neck pain in addition to the mid and low back.  She is wondering about a breast reduction and went to see Dr Cristine Polio in [place] as referred by this office.  She would like to continue investigating this option.  She also suffers from gastritis and takes prilosec OTC but was wondering about a rx for that cosdition as well.  Subjective:    [name], female    DOB: [birth year], 36 y.o.   MRN: [id number]  HPI   36 yo female here to update  the clinic on what is going on with her breast reduction plans. Seen by plastic surgeon , measured to see if she is a candidate.  He wants patient to be seen by chiropractor, pending appointment. Minimal relief from prior subluxation. Using Flexeril with some relief , has plenty. Has been previously  diagnosed  With  gastritis scoped within the last  2 years upper and lower.Using prilosec over the counter. Not taking it regularly due to cost.   Counseled by her MD about  Foods to avoid.   Past Hx  Of abdominal abcess between vaginal and anus area then anal fissure after abcess now healed. Back pain upper and has no radiation into arms and no numbness or tingling.   Review of Systems  Constitutional: Negative for chills and fever.  HENT: Negative for congestion, ear pain and sore throat.   Eyes: Negative for pain and itching.  Respiratory: Negative for shortness of breath.   Cardiovascular: Negative for chest pain.  Gastrointestinal: Positive for abdominal pain.  Genitourinary: Negative for dysuria.  Musculoskeletal: Positive for back pain.  Neurological: Negative for dizziness and syncope.  Psychiatric/Behavioral: Negative for confusion and hallucinations.   Abdominal pain with hx of gastritis. Taking advil for back pain and Bayer, last dose 2 months ago.    Objective:   Physical Exam   Constitutional: She is oriented to person, place, and time. She appears well-developed and well-nourished.  HENT:  Head: Normocephalic and atraumatic.  Eyes: EOM are normal. Pupils are equal, round, and reactive to light.  Neck: Normal range of motion. Neck supple.  Musculoskeletal: Normal range of motion.  Neurological: She is alert and oriented to person, place, and time.  Skin: Skin is warm and dry.  Psychiatric: She has a normal mood and affect. Her behavior is normal. Judgment and thought content normal.  Nursing note and vitals reviewed. muscle spasm noted trapezius area bilateral, R>L no vertebral tenderness.  equal grips  Bilateral and  5/5 strength on upper arm extension and flexion Assessment & Plan:   Back pain, pending further evaluation by plastic surgery for breast reduction.  Gastritis prescribed omeprazole 20mg  one by mouth daily #30  3 refills. If pain gets worse or any other concerns to return to the clinic. Patient mentioned she sometimes feels groggy after the use of the Flexeril, recommended she cut pill in have and do  5 mg at night and that may help with sedative side effects.. She says she will give that a try.

## 2016-07-30 ENCOUNTER — Ambulatory Visit (INDEPENDENT_AMBULATORY_CARE_PROVIDER_SITE_OTHER): Payer: BLUE CROSS/BLUE SHIELD | Admitting: Obstetrics and Gynecology

## 2016-07-30 VITALS — BP 131/85 | HR 111 | Wt 215.6 lb

## 2016-07-30 DIAGNOSIS — E663 Overweight: Secondary | ICD-10-CM

## 2016-07-30 MED ORDER — CYANOCOBALAMIN 1000 MCG/ML IJ SOLN
1000.0000 ug | Freq: Once | INTRAMUSCULAR | Status: AC
  Administered 2016-07-30: 1000 ug via INTRAMUSCULAR

## 2016-07-30 NOTE — Progress Notes (Signed)
Pt is here for wt, bp check, b-12 inj, she is doing well, having some back issues, hoping to get a breast reduction in the near future  07/30/16 wt- 215.6lb 06/29/16 wt- 216lb 05/24/16 wt- 217lb

## 2016-08-05 NOTE — Progress Notes (Signed)
   Subjective:    [name], female    DOB: October 19, 1980, 36 y.o.   MRN: [id number]  HPI Patient had to leave due to time constraints , I did not see the  patient.  Review of Systems     Objective:   Physical Exam        Assessment & Plan:

## 2016-08-28 ENCOUNTER — Ambulatory Visit (INDEPENDENT_AMBULATORY_CARE_PROVIDER_SITE_OTHER): Payer: BLUE CROSS/BLUE SHIELD | Admitting: Obstetrics and Gynecology

## 2016-08-28 VITALS — BP 129/84 | HR 107 | Wt 214.0 lb

## 2016-08-28 DIAGNOSIS — E663 Overweight: Secondary | ICD-10-CM

## 2016-08-28 MED ORDER — CYANOCOBALAMIN 1000 MCG/ML IJ SOLN
1000.0000 ug | Freq: Once | INTRAMUSCULAR | Status: AC
  Administered 2016-08-28: 1000 ug via INTRAMUSCULAR

## 2016-08-28 NOTE — Progress Notes (Signed)
Pt is here for wt, bp check, b-12 inj, she has been out of the phentermine for several days, encouraged her to take her 2 weeks off medication to ensure that when she restarts the medication will work better.  Pt is currently on her period and states she is very bloated and knew her wt was gonna be up.  We discussed exercise and eating right, pt would like refill on her medication if possible.  08/28/16 wt- 214lb 07/30/16 wt- 215lb 06/29/16 wt- 216lb

## 2016-09-27 ENCOUNTER — Ambulatory Visit (INDEPENDENT_AMBULATORY_CARE_PROVIDER_SITE_OTHER): Payer: BLUE CROSS/BLUE SHIELD | Admitting: Obstetrics and Gynecology

## 2016-09-27 VITALS — BP 120/80 | HR 114 | Ht 69.0 in | Wt 214.0 lb

## 2016-09-27 DIAGNOSIS — E663 Overweight: Secondary | ICD-10-CM | POA: Diagnosis not present

## 2016-09-27 MED ORDER — FLUOXETINE HCL 10 MG PO CAPS: 10.0000 mg | ORAL_CAPSULE | Freq: Every day | ORAL | 3 refills | Status: DC

## 2016-09-27 MED ORDER — PHENTERMINE HCL 37.5 MG PO TABS: 37.5000 mg | ORAL_TABLET | Freq: Every day | ORAL | 2 refills | Status: DC

## 2016-09-27 MED ORDER — CYANOCOBALAMIN 1000 MCG/ML IJ SOLN
1000.0000 ug | Freq: Once | INTRAMUSCULAR | Status: AC
  Administered 2016-09-27: 1000 ug via INTRAMUSCULAR

## 2016-09-27 NOTE — Progress Notes (Signed)
Pt is here for wt, bp check, b-12 inj She is having some issues with her birth control- feels very agitated, states her and Melody discussed her going on Prozac and she is ready for a rx for that, also is requesting refill on her Phentermine.  09/27/16 wt- 214lb 08/28/16 wt- 214lb

## 2016-10-02 ENCOUNTER — Other Ambulatory Visit: Payer: Self-pay | Admitting: Family Medicine

## 2016-10-08 ENCOUNTER — Other Ambulatory Visit: Payer: Self-pay | Admitting: Medical

## 2016-10-08 DIAGNOSIS — K2900 Acute gastritis without bleeding: Secondary | ICD-10-CM

## 2016-10-08 MED ORDER — OMEPRAZOLE 20 MG PO CPDR: 20.0000 mg | DELAYED_RELEASE_CAPSULE | Freq: Every day | ORAL | 2 refills | Status: DC

## 2016-10-08 NOTE — Telephone Encounter (Signed)
Refill done for 3 months. Patient to come in for visit before she runs out, for reevaluation.

## 2016-10-12 ENCOUNTER — Other Ambulatory Visit: Payer: BLUE CROSS/BLUE SHIELD

## 2016-10-16 ENCOUNTER — Encounter: Payer: Self-pay | Admitting: Obstetrics and Gynecology

## 2016-10-17 ENCOUNTER — Other Ambulatory Visit: Payer: Self-pay | Admitting: Obstetrics and Gynecology

## 2016-10-17 MED ORDER — ESCITALOPRAM OXALATE 10 MG PO TABS: 10.0000 mg | ORAL_TABLET | Freq: Every day | ORAL | 6 refills | Status: DC

## 2016-10-19 ENCOUNTER — Other Ambulatory Visit: Payer: BLUE CROSS/BLUE SHIELD

## 2016-10-19 ENCOUNTER — Telehealth: Payer: Self-pay

## 2016-10-19 DIAGNOSIS — R7989 Other specified abnormal findings of blood chemistry: Secondary | ICD-10-CM

## 2016-10-19 NOTE — Progress Notes (Signed)
Patient here for labs only needs Vitamin D level per patient was instructed by Talmage Nap, PA-C   Lab ordered  Orders Placed This Encounter  Procedures  . VITAMIN D 25 Hydroxy (Vit-D Deficiency, Fractures)

## 2016-10-20 LAB — SPECIMEN STATUS

## 2016-10-22 ENCOUNTER — Ambulatory Visit (INDEPENDENT_AMBULATORY_CARE_PROVIDER_SITE_OTHER): Payer: BLUE CROSS/BLUE SHIELD | Admitting: Obstetrics and Gynecology

## 2016-10-22 VITALS — BP 118/84 | HR 112 | Wt 213.0 lb

## 2016-10-22 DIAGNOSIS — E663 Overweight: Secondary | ICD-10-CM | POA: Diagnosis not present

## 2016-10-22 MED ORDER — CYANOCOBALAMIN 1000 MCG/ML IJ SOLN
1000.0000 ug | Freq: Once | INTRAMUSCULAR | Status: AC
  Administered 2016-10-22: 1000 ug via INTRAMUSCULAR

## 2016-10-22 NOTE — Progress Notes (Signed)
Pt is here for wt, bp check, b-12 inj She is doing well, got approved for her breast reduction she was very excited  10/22/16 wt-213 09/27/16 wt- 214

## 2016-10-23 ENCOUNTER — Encounter: Payer: Self-pay | Admitting: Medical

## 2016-10-23 ENCOUNTER — Ambulatory Visit: Payer: BLUE CROSS/BLUE SHIELD | Admitting: Medical

## 2016-10-23 VITALS — BP 110/80 | HR 81 | Temp 98.7°F | Resp 16 | Ht 69.0 in | Wt 214.0 lb

## 2016-10-23 DIAGNOSIS — M545 Low back pain, unspecified: Secondary | ICD-10-CM

## 2016-10-23 DIAGNOSIS — M62830 Muscle spasm of back: Secondary | ICD-10-CM

## 2016-10-23 DIAGNOSIS — G8929 Other chronic pain: Secondary | ICD-10-CM

## 2016-10-23 MED ORDER — CYCLOBENZAPRINE HCL 10 MG PO TABS: 10.0000 mg | ORAL_TABLET | Freq: Three times a day (TID) | ORAL | 2 refills | Status: DC | PRN

## 2016-10-23 NOTE — Progress Notes (Signed)
   Subjective:    [name], female    DOB: 14-Mar-1980, 36 y.o.   MRN: [id number]  HPI  36 yo female non-acute distress come into today for back pain in lower and upper shoulders . Keeps lower back pain all the time and stiffness in shoulders.  Approved for breast reduction on December 17th 2018.  Took Prozac and had bad side effects, urinating more, extreme nightmares, "I felt off". Decreased appetitie and changed the taste of food. OB/GYN started patient on Lexapro, trying to get her PMS undercontrol but patient is waiting to take it till a weekend in case of potential side effects.  She also has recently tried a float tank which has helped her back and her anxiety through relaxation.   Review of Systems  Constitutional: Negative for chills and fever.  HENT: Negative for ear pain, sinus pain and sore throat.   Eyes: Negative for discharge and itching.  Respiratory: Negative for cough and shortness of breath.   Cardiovascular: Negative for chest pain, palpitations and leg swelling.  Gastrointestinal: Negative for abdominal pain.  Endocrine: Negative for polydipsia, polyphagia and polyuria.       Objective:   Physical Exam  Constitutional: She is oriented to person, place, and time. She appears well-developed and well-nourished.  HENT:  Head: Normocephalic and atraumatic.  Eyes: Pupils are equal, round, and reactive to light. Conjunctivae and EOM are normal.  Neck: Normal range of motion. Neck supple.  Cardiovascular: Normal rate, regular rhythm and normal heart sounds.   Pulmonary/Chest: Effort normal and breath sounds normal.  Neurological: She is alert and oriented to person, place, and time.  Skin: Skin is warm and dry.  Psychiatric: She has a normal mood and affect. Her behavior is normal. Judgment and thought content normal.  Nursing note and vitals reviewed.   non-vertebral tenderness of midline on palpation. Mild spasm lower back bilaterally.  Shoulders wnl today.        Assessment & Plan:  Muscle spasm and  Chronic Back pain. Pending breast reduction to be done by Dr. Cristine Polio in [place] in December 2018. Reviewed vitamin D3 level now at  44.9, to go to  every other day , (she has a supply of the  5000IU pills) then when she is finished with those she will go to  2000IU/day.  Knows not to take sleep aid at night with muscle relaxer and caution with taking  Klonopin due to both CNS depressants. She verbalizes understanding and has no questions at discharge.

## 2016-10-23 NOTE — Patient Instructions (Signed)

## 2016-11-02 LAB — VITAMIN D 25 HYDROXY (VIT D DEFICIENCY, FRACTURES): Vit D, 25-Hydroxy: 44.9 ng/mL (ref 30.0–100.0)

## 2016-11-06 ENCOUNTER — Other Ambulatory Visit: Payer: Self-pay | Admitting: *Deleted

## 2016-11-06 MED ORDER — CLONAZEPAM 2 MG PO TABS: 2.0000 mg | ORAL_TABLET | Freq: Every evening | ORAL | 3 refills | Status: DC | PRN

## 2016-11-07 ENCOUNTER — Encounter: Payer: Self-pay | Admitting: Obstetrics and Gynecology

## 2016-11-09 ENCOUNTER — Encounter: Payer: Self-pay | Admitting: *Deleted

## 2016-11-19 ENCOUNTER — Encounter: Payer: Self-pay | Admitting: Obstetrics and Gynecology

## 2016-11-19 ENCOUNTER — Ambulatory Visit (INDEPENDENT_AMBULATORY_CARE_PROVIDER_SITE_OTHER): Payer: BLUE CROSS/BLUE SHIELD | Admitting: Obstetrics and Gynecology

## 2016-11-19 VITALS — BP 131/82 | HR 101 | Wt 216.7 lb

## 2016-11-19 DIAGNOSIS — E663 Overweight: Secondary | ICD-10-CM | POA: Diagnosis not present

## 2016-11-19 MED ORDER — CYANOCOBALAMIN 1000 MCG/ML IJ SOLN
1000.0000 ug | Freq: Once | INTRAMUSCULAR | Status: AC
  Administered 2016-11-19: 1000 ug via INTRAMUSCULAR

## 2016-11-19 NOTE — Progress Notes (Signed)
Pt is here for wt, bp check, b-12 inj She is doing well, is having a breast reduction in December and is very excited about this.   11/19/16 wt- 216.7lb 10/22/16 wt- 213lb

## 2016-12-14 ENCOUNTER — Encounter: Payer: Self-pay | Admitting: Obstetrics and Gynecology

## 2016-12-17 ENCOUNTER — Encounter: Payer: Self-pay | Admitting: Medical

## 2016-12-17 ENCOUNTER — Ambulatory Visit: Payer: BLUE CROSS/BLUE SHIELD | Admitting: Medical

## 2016-12-17 ENCOUNTER — Ambulatory Visit: Payer: BLUE CROSS/BLUE SHIELD | Admitting: Obstetrics and Gynecology

## 2016-12-17 VITALS — BP 108/78 | HR 96 | Temp 99.0°F | Wt 217.4 lb

## 2016-12-17 DIAGNOSIS — J069 Acute upper respiratory infection, unspecified: Secondary | ICD-10-CM

## 2016-12-17 DIAGNOSIS — R05 Cough: Secondary | ICD-10-CM

## 2016-12-17 DIAGNOSIS — R059 Cough, unspecified: Secondary | ICD-10-CM

## 2016-12-17 DIAGNOSIS — J0100 Acute maxillary sinusitis, unspecified: Secondary | ICD-10-CM

## 2016-12-17 MED ORDER — BENZONATATE 100 MG PO CAPS: 100.0000 mg | ORAL_CAPSULE | Freq: Three times a day (TID) | ORAL | 0 refills | Status: DC | PRN

## 2016-12-17 MED ORDER — AMOXICILLIN-POT CLAVULANATE 875-125 MG PO TABS: 1.0000 | ORAL_TABLET | Freq: Two times a day (BID) | ORAL | 0 refills | Status: DC
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
## 2016-12-17 NOTE — Patient Instructions (Addendum)
Take medications as directed, OTC Flonase take as directed. Return in  3-5 days if not improving.  Cough, Adult A cough helps to clear your throat and lungs. A cough may last only 2-3 weeks (acute), or it may last longer than 8 weeks (chronic). Many different things can cause a cough. A cough may be a sign of an illness or another medical condition. Follow these instructions at home:  Pay attention to any changes in your cough.  Take medicines only as told by your doctor. ? If you were prescribed an antibiotic medicine, take it as told by your doctor. Do not stop taking it even if you start to feel better. ? Talk with your doctor before you try using a cough medicine.  Drink enough fluid to keep your pee (urine) clear or pale yellow.  If the air is dry, use a cold steam vaporizer or humidifier in your home.  Stay away from things that make you cough at work or at home.  If your cough is worse at night, try using extra pillows to raise your head up higher while you sleep.  Do not smoke, and try not to be around smoke. If you need help quitting, ask your doctor.  Do not have caffeine.  Do not drink alcohol.  Rest as needed. Contact a doctor if:  You have new problems (symptoms).  You cough up yellow fluid (pus).  Your cough does not get better after 2-3 weeks, or your cough gets worse.  Medicine does not help your cough and you are not sleeping well.  You have pain that gets worse or pain that is not helped with medicine.  You have a fever.  You are losing weight and you do not know why.  You have night sweats. Get help right away if:  You cough up blood.  You have trouble breathing.  Your heartbeat is very fast. This information is not intended to replace advice given to you by your health care provider. Make sure you discuss any questions you have with your health care provider. Document Released: 09/21/2010 Document Revised: 06/16/2015 Document Reviewed:  03/17/2014 Elsevier Interactive Patient Education  2018 [employer]. Upper Respiratory Infection, Adult Most upper respiratory infections (URIs) are caused by a virus. A URI affects the nose, throat, and upper air passages. The most common type of URI is often called "the common cold." Follow these instructions at home:  Take medicines only as told by your doctor.  Gargle warm saltwater or take cough drops to comfort your throat as told by your doctor.  Use a warm mist humidifier or inhale steam from a shower to increase air moisture. This may make it easier to breathe.  Drink enough fluid to keep your pee (urine) clear or pale yellow.  Eat soups and other clear broths.  Have a healthy diet.  Rest as needed.  Go back to work when your fever is gone or your doctor says it is okay. ? You may need to stay home longer to avoid giving your URI to others. ? You can also wear a face mask and wash your hands often to prevent spread of the virus.  Use your inhaler more if you have asthma.  Do not use any tobacco products, including cigarettes, chewing tobacco, or electronic cigarettes. If you need help quitting, ask your doctor. Contact a doctor if:  You are getting worse, not better.  Your symptoms are not helped by medicine.  You have chills.  You  are getting more short of breath.  You have brown or red mucus.  You have yellow or brown discharge from your nose.  You have pain in your face, especially when you bend forward.  You have a fever.  You have puffy (swollen) neck glands.  You have pain while swallowing.  You have white areas in the back of your throat. Get help right away if:  You have very bad or constant: ? Headache. ? Ear pain. ? Pain in your forehead, behind your eyes, and over your cheekbones (sinus pain). ? Chest pain.  You have long-lasting (chronic) lung disease and any of the following: ? Wheezing. ? Long-lasting cough. ? Coughing up  blood. ? A change in your usual mucus.  You have a stiff neck.  You have changes in your: ? Vision. ? Hearing. ? Thinking. ? Mood. This information is not intended to replace advice given to you by your health care provider. Make sure you discuss any questions you have with your health care provider. Document Released: 06/27/2007 Document Revised: 09/11/2015 Document Reviewed: 04/15/2013 Elsevier Interactive Patient Education  2018 [employer]. Sinusitis, Adult Sinusitis is soreness and inflammation of your sinuses. Sinuses are hollow spaces in the bones around your face. They are located:  Around your eyes.  In the middle of your forehead.  Behind your nose.  In your cheekbones.  Your sinuses and nasal passages are lined with a stringy fluid (mucus). Mucus normally drains out of your sinuses. When your nasal tissues get inflamed or swollen, the mucus can get trapped or blocked so air cannot flow through your sinuses. This lets bacteria, viruses, and funguses grow, and that leads to infection. Follow these instructions at home: Medicines  Take, use, or apply over-the-counter and prescription medicines only as told by your doctor. These may include nasal sprays.  If you were prescribed an antibiotic medicine, take it as told by your doctor. Do not stop taking the antibiotic even if you start to feel better. Hydrate and Humidify  Drink enough water to keep your pee (urine) clear or pale yellow.  Use a cool mist humidifier to keep the humidity level in your home above 50%.  Breathe in steam for 10-15 minutes, 3-4 times a day or as told by your doctor. You can do this in the bathroom while a hot shower is running.  Try not to spend time in cool or dry air. Rest  Rest as much as possible.  Sleep with your head raised (elevated).  Make sure to get enough sleep each night. General instructions  Put a warm, moist washcloth on your face 3-4 times a day or as told by your  doctor. This will help with discomfort.  Wash your hands often with soap and water. If there is no soap and water, use hand sanitizer.  Do not smoke. Avoid being around people who are smoking (secondhand smoke).  Keep all follow-up visits as told by your doctor. This is important. Contact a doctor if:  You have a fever.  Your symptoms get worse.  Your symptoms do not get better within 10 days. Get help right away if:  You have a very bad headache.  You cannot stop throwing up (vomiting).  You have pain or swelling around your face or eyes.  You have trouble seeing.  You feel confused.  Your neck is stiff.  You have trouble breathing. This information is not intended to replace advice given to you by your health care provider.  Make sure you discuss any questions you have with your health care provider. Document Released: 06/27/2007 Document Revised: 09/04/2015 Document Reviewed: 11/03/2014 Elsevier Interactive Patient Education  [employer].

## 2016-12-28 ENCOUNTER — Encounter: Payer: Self-pay | Admitting: Obstetrics and Gynecology

## 2016-12-28 ENCOUNTER — Ambulatory Visit (INDEPENDENT_AMBULATORY_CARE_PROVIDER_SITE_OTHER): Payer: BLUE CROSS/BLUE SHIELD | Admitting: Obstetrics and Gynecology

## 2016-12-28 VITALS — BP 126/83 | HR 95 | Wt 215.0 lb

## 2016-12-28 DIAGNOSIS — E663 Overweight: Secondary | ICD-10-CM | POA: Diagnosis not present

## 2016-12-28 MED ORDER — CYANOCOBALAMIN 1000 MCG/ML IJ SOLN
1000.0000 ug | Freq: Once | INTRAMUSCULAR | Status: AC
  Administered 2016-12-28: 1000 ug via INTRAMUSCULAR

## 2016-12-28 NOTE — Progress Notes (Signed)
Pt is here for wt, bp check, b-12 inj She is doing well, she is very excited about her upcoming breast reduction surgery  12/28/16 wt- 215lb 11/19/16 wt- 216lb

## 2017-01-02 ENCOUNTER — Other Ambulatory Visit: Payer: Self-pay

## 2017-01-02 ENCOUNTER — Encounter (HOSPITAL_BASED_OUTPATIENT_CLINIC_OR_DEPARTMENT_OTHER): Payer: Self-pay | Admitting: *Deleted

## 2017-01-04 NOTE — H&P (Signed)
Veronica Reynolds is an 36 y.o. female.   Chief Complaint: Increased macromastia MBT:DHRC and shoulder pain with increased intertrigo  Past Medical History:  Diagnosis Date  . Anxiety   . Asthma    as child  . Depression   . Exercise-induced asthma 1985   as a child  . Gastritis   . GERD (gastroesophageal reflux disease)   . Migraine    tension - daily, migraines - 3x/mo  . Sleep disturbance     Past Surgical History:  Procedure Laterality Date  . anal fissue repair    . CARPAL TUNNEL RELEASE Left 01/25/2015   Procedure: CARPAL TUNNEL RELEASE;  Surgeon: Hessie Knows, MD;  Location: [facility];  Service: Orthopedics;  Laterality: Left;  . COLONOSCOPY WITH PROPOFOL N/A 12/10/2014   Procedure: COLONOSCOPY WITH PROPOFOL;  Surgeon: Lucilla Lame, MD;  Location: [place];  Service: Endoscopy;  Laterality: N/A;  . ESOPHAGOGASTRODUODENOSCOPY (EGD) WITH PROPOFOL N/A 12/10/2014   Procedure: ESOPHAGOGASTRODUODENOSCOPY (EGD) WITH PROPOFOL;  Surgeon: Lucilla Lame, MD;  Location: [place];  Service: Endoscopy;  Laterality: N/A;  GASTRIC BX    Family History  Problem Relation Age of Onset  . Atrial fibrillation Mother   . Alcohol abuse Father   . Arthritis Maternal Grandmother    Social History:  reports that she quit smoking about 4 years ago. Her smoking use included cigarettes. She smoked 1.00 pack per day. she has never used smokeless tobacco. She reports that she uses drugs. Drug: Marijuana. She reports that she does not drink alcohol.  Allergies:  Allergies  Allergen Reactions  . Codeine Nausea And Vomiting  . Prozac [Fluoxetine Hcl]     Nightmares, increased urination, decreased appetite, and not feeling like herself.    No medications prior to admission.    No results found for this or any previous visit (from the past 48 hour(s)). No results found.  Review of Systems  Constitutional: Negative.   Eyes: Negative.   Respiratory: Negative.   Cardiovascular:  Negative.   Gastrointestinal: Negative.   Genitourinary: Negative.   Musculoskeletal: Positive for back pain, myalgias and neck pain.  Skin: Positive for rash.  Endo/Heme/Allergies: Negative.   Psychiatric/Behavioral: Negative.     Height 5\' 9"  (1.753 m), weight 97.5 kg (215 lb), last menstrual period 12/19/2016. Physical Exam   Assessment/Plan Severe macromastia  For bilateral breast reductions and excision of accessory breast tissue  Branton Einstein L, MD 01/04/2017, 7:39 AM

## 2017-01-06 NOTE — Anesthesia Preprocedure Evaluation (Addendum)
Anesthesia Evaluation  Patient identified by MRN, date of birth, ID band Patient awake    Reviewed: Allergy & Precautions, H&P , NPO status , Patient's Chart, lab work & pertinent test results  Airway Mallampati: II  TM Distance: >3 FB Neck ROM: full    Dental  (+) Teeth Intact, Dental Advisory Given   Pulmonary former smoker,  Childhood asthma   breath sounds clear to auscultation       Cardiovascular Exercise Tolerance: Good negative cardio ROS   Rhythm:Regular Rate:Normal     Neuro/Psych  Headaches, PSYCHIATRIC DISORDERS Anxiety Depression    GI/Hepatic Neg liver ROS, GERD  Controlled and Medicated,  Endo/Other  Obesity  Renal/GU negative Renal ROS  negative genitourinary   Musculoskeletal   Abdominal   Peds  Hematology negative hematology ROS (+)   Anesthesia Other Findings   Reproductive/Obstetrics negative OB ROS                            Anesthesia Physical  Anesthesia Plan  ASA: II  Anesthesia Plan: General   Post-op Pain Management:    Induction: Intravenous  PONV Risk Score and Plan: 3 and Treatment may vary due to age or medical condition, Ondansetron, Dexamethasone, Midazolam and Scopolamine patch - Pre-op  Airway Management Planned: Oral ETT  Additional Equipment: None  Intra-op Plan:   Post-operative Plan: Extubation in OR  Informed Consent: I have reviewed the patients History and Physical, chart, labs and discussed the procedure including the risks, benefits and alternatives for the proposed anesthesia with the patient or authorized representative who has indicated his/her understanding and acceptance.   Dental advisory given  Plan Discussed with: CRNA  Anesthesia Plan Comments:       Anesthesia Quick Evaluation

## 2017-01-07 ENCOUNTER — Ambulatory Visit (HOSPITAL_BASED_OUTPATIENT_CLINIC_OR_DEPARTMENT_OTHER): Payer: BLUE CROSS/BLUE SHIELD | Admitting: Anesthesiology

## 2017-01-07 ENCOUNTER — Other Ambulatory Visit: Payer: Self-pay

## 2017-01-07 ENCOUNTER — Ambulatory Visit (HOSPITAL_BASED_OUTPATIENT_CLINIC_OR_DEPARTMENT_OTHER)
Admission: RE | Admit: 2017-01-07 | Discharge: 2017-01-07 | Disposition: A | Discharge status: Home / Self Care | Payer: BLUE CROSS/BLUE SHIELD | Source: Ambulatory Visit | Attending: Specialist | Admitting: Specialist

## 2017-01-07 ENCOUNTER — Encounter (HOSPITAL_BASED_OUTPATIENT_CLINIC_OR_DEPARTMENT_OTHER)
Admission: RE | Disposition: A | Discharge status: Home / Self Care | Payer: Self-pay | Source: Ambulatory Visit | Attending: Specialist

## 2017-01-07 ENCOUNTER — Encounter (HOSPITAL_BASED_OUTPATIENT_CLINIC_OR_DEPARTMENT_OTHER): Payer: Self-pay | Admitting: Anesthesiology

## 2017-01-07 DIAGNOSIS — E669 Obesity, unspecified: Secondary | ICD-10-CM | POA: Insufficient documentation

## 2017-01-07 DIAGNOSIS — Z87891 Personal history of nicotine dependence: Secondary | ICD-10-CM | POA: Insufficient documentation

## 2017-01-07 DIAGNOSIS — N62 Hypertrophy of breast: Secondary | ICD-10-CM | POA: Insufficient documentation

## 2017-01-07 DIAGNOSIS — L304 Erythema intertrigo: Secondary | ICD-10-CM | POA: Insufficient documentation

## 2017-01-07 DIAGNOSIS — Z6832 Body mass index (BMI) 32.0-32.9, adult: Secondary | ICD-10-CM | POA: Diagnosis not present

## 2017-01-07 HISTORY — PX: BREAST REDUCTION SURGERY: SHX8

## 2017-01-07 SURGERY — BREAST REDUCTION WITH LIPOSUCTION
Anesthesia: General | Site: Breast | Laterality: Bilateral

## 2017-01-07 MED ORDER — CEFAZOLIN SODIUM-DEXTROSE 2-4 GM/100ML-% IV SOLN
2.0000 g | INTRAVENOUS | Status: AC
  Administered 2017-01-07: 2 g via INTRAVENOUS

## 2017-01-07 MED ORDER — LACTATED RINGERS IV SOLN
INTRAVENOUS | Status: DC
  Administered 2017-01-07 (×2): via INTRAVENOUS

## 2017-01-07 MED ORDER — LIDOCAINE-EPINEPHRINE (PF) 1 %-1:200000 IJ SOLN
INTRAMUSCULAR | Status: AC
  Filled 2017-01-07: qty 30

## 2017-01-07 MED ORDER — CHLORHEXIDINE GLUCONATE CLOTH 2 % EX PADS: 6.0000 | MEDICATED_PAD | Freq: Once | CUTANEOUS | Status: DC

## 2017-01-07 MED ORDER — DEXAMETHASONE SODIUM PHOSPHATE 10 MG/ML IJ SOLN
INTRAMUSCULAR | Status: AC
  Filled 2017-01-07: qty 1

## 2017-01-07 MED ORDER — MIDAZOLAM HCL 2 MG/2ML IJ SOLN
INTRAMUSCULAR | Status: AC
  Filled 2017-01-07: qty 2

## 2017-01-07 MED ORDER — CEFAZOLIN SODIUM 1 G IJ SOLR
INTRAMUSCULAR | Status: AC
  Filled 2017-01-07: qty 30

## 2017-01-07 MED ORDER — SODIUM BICARBONATE 4 % IV SOLN
INTRAVENOUS | Status: AC
  Filled 2017-01-07: qty 20

## 2017-01-07 MED ORDER — PROPOFOL 10 MG/ML IV BOLUS
INTRAVENOUS | Status: DC | PRN
  Administered 2017-01-07: 180 mg via INTRAVENOUS

## 2017-01-07 MED ORDER — ROCURONIUM BROMIDE 10 MG/ML (PF) SYRINGE
PREFILLED_SYRINGE | INTRAVENOUS | Status: AC
  Filled 2017-01-07: qty 5

## 2017-01-07 MED ORDER — SCOPOLAMINE 1 MG/3DAYS TD PT72: 1.0000 | MEDICATED_PATCH | Freq: Once | TRANSDERMAL | Status: DC | PRN

## 2017-01-07 MED ORDER — OXYCODONE HCL 5 MG PO TABS
ORAL_TABLET | ORAL | Status: AC
  Filled 2017-01-07: qty 1

## 2017-01-07 MED ORDER — FENTANYL CITRATE (PF) 100 MCG/2ML IJ SOLN
25.0000 ug | INTRAMUSCULAR | Status: DC | PRN
  Administered 2017-01-07: 50 ug via INTRAVENOUS
  Administered 2017-01-07: 25 ug via INTRAVENOUS

## 2017-01-07 MED ORDER — MORPHINE SULFATE (PF) 10 MG/ML IV SOLN
INTRAVENOUS | Status: AC
  Filled 2017-01-07: qty 1

## 2017-01-07 MED ORDER — PROPOFOL 500 MG/50ML IV EMUL
INTRAVENOUS | Status: AC
  Filled 2017-01-07: qty 50

## 2017-01-07 MED ORDER — FENTANYL CITRATE (PF) 100 MCG/2ML IJ SOLN: 50.0000 ug | INTRAMUSCULAR | Status: DC | PRN

## 2017-01-07 MED ORDER — MIDAZOLAM HCL 2 MG/2ML IJ SOLN
1.0000 mg | INTRAMUSCULAR | Status: DC | PRN
  Administered 2017-01-07 (×2): 2 mg via INTRAVENOUS

## 2017-01-07 MED ORDER — EPHEDRINE SULFATE 50 MG/ML IJ SOLN
INTRAMUSCULAR | Status: DC | PRN
  Administered 2017-01-07: 10 mg via INTRAVENOUS

## 2017-01-07 MED ORDER — PHENYLEPHRINE HCL 10 MG/ML IJ SOLN
INTRAMUSCULAR | Status: AC
  Filled 2017-01-07: qty 1

## 2017-01-07 MED ORDER — LIDOCAINE 2% (20 MG/ML) 5 ML SYRINGE
INTRAMUSCULAR | Status: AC
  Filled 2017-01-07: qty 5

## 2017-01-07 MED ORDER — MORPHINE SULFATE 10 MG/ML IJ SOLN
INTRAMUSCULAR | Status: DC | PRN
  Administered 2017-01-07 (×2): 2 mg via INTRAVENOUS

## 2017-01-07 MED ORDER — SUGAMMADEX SODIUM 200 MG/2ML IV SOLN
INTRAVENOUS | Status: DC | PRN
  Administered 2017-01-07: 4 mg via INTRAVENOUS

## 2017-01-07 MED ORDER — OXYCODONE HCL 5 MG PO TABS
5.0000 mg | ORAL_TABLET | Freq: Once | ORAL | Status: AC | PRN
  Administered 2017-01-07: 5 mg via ORAL

## 2017-01-07 MED ORDER — LIDOCAINE HCL 2 % IJ SOLN
INTRAMUSCULAR | Status: AC
  Filled 2017-01-07: qty 100

## 2017-01-07 MED ORDER — EPINEPHRINE 30 MG/30ML IJ SOLN
INTRAMUSCULAR | Status: AC
  Filled 2017-01-07: qty 1

## 2017-01-07 MED ORDER — EPHEDRINE SULFATE 50 MG/ML IJ SOLN: INTRAMUSCULAR | Status: DC | PRN

## 2017-01-07 MED ORDER — PROMETHAZINE HCL 25 MG/ML IJ SOLN: 6.2500 mg | INTRAMUSCULAR | Status: DC | PRN

## 2017-01-07 MED ORDER — FENTANYL CITRATE (PF) 100 MCG/2ML IJ SOLN
INTRAMUSCULAR | Status: AC
  Filled 2017-01-07: qty 2

## 2017-01-07 MED ORDER — EPINEPHRINE PF 1 MG/ML IJ SOLN
INTRAMUSCULAR | Status: DC | PRN
  Administered 2017-01-07: 1 mg via SUBCUTANEOUS

## 2017-01-07 MED ORDER — SUGAMMADEX SODIUM 200 MG/2ML IV SOLN
INTRAVENOUS | Status: AC
  Filled 2017-01-07: qty 2

## 2017-01-07 MED ORDER — LIDOCAINE HCL (CARDIAC) 20 MG/ML IV SOLN
INTRAVENOUS | Status: DC | PRN
  Administered 2017-01-07: 50 mg via INTRAVENOUS

## 2017-01-07 MED ORDER — LIDOCAINE-EPINEPHRINE 0.5 %-1:200000 IJ SOLN
INTRAMUSCULAR | Status: AC
  Filled 2017-01-07: qty 2

## 2017-01-07 MED ORDER — SODIUM BICARBONATE 4 % IV SOLN
INTRAVENOUS | Status: DC | PRN
  Administered 2017-01-07: 5 mL via SUBCUTANEOUS

## 2017-01-07 MED ORDER — CEFAZOLIN SODIUM-DEXTROSE 2-4 GM/100ML-% IV SOLN
INTRAVENOUS | Status: AC
  Filled 2017-01-07: qty 100

## 2017-01-07 MED ORDER — ONDANSETRON HCL 4 MG/2ML IJ SOLN
INTRAMUSCULAR | Status: AC
  Filled 2017-01-07: qty 2

## 2017-01-07 MED ORDER — DEXAMETHASONE SODIUM PHOSPHATE 4 MG/ML IJ SOLN
INTRAMUSCULAR | Status: DC | PRN
  Administered 2017-01-07: 10 mg via INTRAVENOUS

## 2017-01-07 MED ORDER — ROCURONIUM BROMIDE 100 MG/10ML IV SOLN
INTRAVENOUS | Status: DC | PRN
  Administered 2017-01-07: 60 mg via INTRAVENOUS

## 2017-01-07 MED ORDER — EPHEDRINE 5 MG/ML INJ
INTRAVENOUS | Status: AC
  Filled 2017-01-07: qty 10

## 2017-01-07 MED ORDER — LIDOCAINE HCL 2 % IJ SOLN
INTRAMUSCULAR | Status: DC | PRN
  Administered 2017-01-07: 100 mL

## 2017-01-07 MED ORDER — ONDANSETRON HCL 4 MG/2ML IJ SOLN
INTRAMUSCULAR | Status: DC | PRN
  Administered 2017-01-07: 4 mg via INTRAVENOUS

## 2017-01-07 MED ORDER — PHENYLEPHRINE HCL 10 MG/ML IJ SOLN
INTRAVENOUS | Status: DC | PRN
  Administered 2017-01-07: 50 ug/min via INTRAVENOUS

## 2017-01-07 SURGICAL SUPPLY — 66 items
BAG DECANTER FOR FLEXI CONT (MISCELLANEOUS) IMPLANT
BENZOIN TINCTURE PRP APPL 2/3 (GAUZE/BANDAGES/DRESSINGS) ×8 IMPLANT
BLADE KNIFE PERSONA 10 (BLADE) ×8 IMPLANT
BLADE KNIFE PERSONA 15 (BLADE) ×8 IMPLANT
CANISTER SUCT 1200ML W/VALVE (MISCELLANEOUS) ×4 IMPLANT
COVER BACK TABLE 60X90IN (DRAPES) ×4 IMPLANT
COVER MAYO STAND STRL (DRAPES) ×4 IMPLANT
DECANTER SPIKE VIAL GLASS SM (MISCELLANEOUS) IMPLANT
DRAIN CHANNEL 10M FLAT 3/4 FLT (DRAIN) ×8 IMPLANT
DRAIN PENROSE 1/4X12 LTX STRL (WOUND CARE) IMPLANT
DRAPE LAPAROSCOPIC ABDOMINAL (DRAPES) ×4 IMPLANT
DRSG PAD ABDOMINAL 8X10 ST (GAUZE/BANDAGES/DRESSINGS) ×16 IMPLANT
ELECT BLADE 6.5 .24CM SHAFT (ELECTRODE) IMPLANT
ELECT REM PT RETURN 9FT ADLT (ELECTROSURGICAL) ×4
ELECTRODE REM PT RTRN 9FT ADLT (ELECTROSURGICAL) ×2 IMPLANT
EVACUATOR SILICONE 100CC (DRAIN) ×8 IMPLANT
FILTER 7/8 IN (FILTER) IMPLANT
GAUZE SPONGE 4X4 12PLY STRL (GAUZE/BANDAGES/DRESSINGS) ×8 IMPLANT
GAUZE XEROFORM 5X9 LF (GAUZE/BANDAGES/DRESSINGS) ×8 IMPLANT
GLOVE BIO SURGEON STRL SZ 6.5 (GLOVE) ×3 IMPLANT
GLOVE BIO SURGEONS STRL SZ 6.5 (GLOVE) ×1
GLOVE BIOGEL M STRL SZ7.5 (GLOVE) ×4 IMPLANT
GLOVE BIOGEL PI IND STRL 8 (GLOVE) ×2 IMPLANT
GLOVE BIOGEL PI INDICATOR 8 (GLOVE) ×2
GLOVE ECLIPSE 7.0 STRL STRAW (GLOVE) ×4 IMPLANT
GOWN STRL REUS W/ TWL LRG LVL3 (GOWN DISPOSABLE) IMPLANT
GOWN STRL REUS W/ TWL XL LVL3 (GOWN DISPOSABLE) ×4 IMPLANT
GOWN STRL REUS W/TWL LRG LVL3 (GOWN DISPOSABLE)
GOWN STRL REUS W/TWL XL LVL3 (GOWN DISPOSABLE) ×4
IV NS 500ML (IV SOLUTION)
IV NS 500ML BAXH (IV SOLUTION) IMPLANT
KIT FILL SYSTEM UNIVERSAL (SET/KITS/TRAYS/PACK) IMPLANT
MARKER SKIN DUAL TIP RULER LAB (MISCELLANEOUS) ×4 IMPLANT
NDL SAFETY ECLIPSE 18X1.5 (NEEDLE) ×2 IMPLANT
NEEDLE HYPO 18GX1.5 SHARP (NEEDLE) ×2
NEEDLE HYPO 25X1 1.5 SAFETY (NEEDLE) IMPLANT
NEEDLE SPNL 18GX3.5 QUINCKE PK (NEEDLE) IMPLANT
NS IRRIG 1000ML POUR BTL (IV SOLUTION) ×8 IMPLANT
PACK BASIN DAY SURGERY FS (CUSTOM PROCEDURE TRAY) ×4 IMPLANT
PEN SKIN MARKING BROAD TIP (MISCELLANEOUS) ×4 IMPLANT
PIN SAFETY STERILE (MISCELLANEOUS) ×4 IMPLANT
SLEEVE SCD COMPRESS KNEE MED (MISCELLANEOUS) ×4 IMPLANT
SPONGE LAP 18X18 X RAY DECT (DISPOSABLE) ×16 IMPLANT
STRIP SUTURE WOUND CLOSURE 1/2 (SUTURE) IMPLANT
SUT MNCRL AB 3-0 PS2 18 (SUTURE) ×16 IMPLANT
SUT MON AB 2-0 CT1 36 (SUTURE) ×16 IMPLANT
SUT MON AB 5-0 PS2 18 (SUTURE) ×16 IMPLANT
SUT PROLENE 3 0 PS 2 (SUTURE) ×8 IMPLANT
SUT VLOC 90 P-14 23 (SUTURE) ×8 IMPLANT
SYR 20CC LL (SYRINGE) IMPLANT
SYR 50ML LL SCALE MARK (SYRINGE) ×8 IMPLANT
SYR BULB IRRIGATION 50ML (SYRINGE) ×4 IMPLANT
SYR CONTROL 10ML LL (SYRINGE) IMPLANT
TAPE HYPAFIX 6 X30' (GAUZE/BANDAGES/DRESSINGS) ×1
TAPE HYPAFIX 6X30 (GAUZE/BANDAGES/DRESSINGS) ×3 IMPLANT
TAPE MEASURE 72IN RETRACT (INSTRUMENTS)
TAPE MEASURE LINEN 72IN RETRCT (INSTRUMENTS) IMPLANT
TOWEL OR 17X24 6PK STRL BLUE (TOWEL DISPOSABLE) ×16 IMPLANT
TRAY DSU PREP LF (CUSTOM PROCEDURE TRAY) ×4 IMPLANT
TUBE CONNECTING 20'X1/4 (TUBING) ×1
TUBE CONNECTING 20X1/4 (TUBING) ×3 IMPLANT
TUBING INFILTRATION IT-10001 (TUBING) ×4 IMPLANT
TUBING SET GRADUATE ASPIR 12FT (MISCELLANEOUS) ×4 IMPLANT
UNDERPAD 30X30 (UNDERPADS AND DIAPERS) ×8 IMPLANT
VAC PENCILS W/TUBING CLEAR (MISCELLANEOUS) ×4 IMPLANT
YANKAUER SUCT BULB TIP NO VENT (SUCTIONS) ×4 IMPLANT

## 2017-01-07 NOTE — Anesthesia Procedure Notes (Signed)
Procedure Name: Intubation Performed by: Terrance Mass, CRNA Pre-anesthesia Checklist: Patient identified, Emergency Drugs available, Suction available and Patient being monitored Patient Re-evaluated:Patient Re-evaluated prior to induction Oxygen Delivery Method: Circle system utilized Preoxygenation: Pre-oxygenation with 100% oxygen Induction Type: IV induction Ventilation: Mask ventilation without difficulty Laryngoscope Size: Miller and 2 Grade View: Grade I Tube type: Oral Tube size: 7.0 mm Number of attempts: 1 Airway Equipment and Method: Stylet and Oral airway Placement Confirmation: ETT inserted through vocal cords under direct vision,  positive ETCO2 and breath sounds checked- equal and bilateral Secured at: 21 (21) cm Tube secured with: Tape Dental Injury: Teeth and Oropharynx as per pre-operative assessment

## 2017-01-07 NOTE — Anesthesia Postprocedure Evaluation (Signed)
Anesthesia Post Note  Patient: Veronica Reynolds  Procedure(s) Performed: Bilateral Breast Reductions and Excision of Accessory Breast Tissue (Bilateral Breast)     Patient location during evaluation: PACU Anesthesia Type: General Level of consciousness: awake and alert Pain management: pain level controlled Vital Signs Assessment: post-procedure vital signs reviewed and stable Respiratory status: spontaneous breathing, nonlabored ventilation and respiratory function stable Cardiovascular status: blood pressure returned to baseline and stable Postop Assessment: no apparent nausea or vomiting Anesthetic complications: no    Last Vitals:  Vitals:   01/07/17 1100 01/07/17 1115  BP: (!) 97/55 104/68  Pulse: 80 82  Resp: 14 15  Temp:    SpO2: 97% 96%    Last Pain:  Vitals:   01/07/17 1115  TempSrc:   PainSc: [name]

## 2017-01-07 NOTE — Brief Op Note (Signed)
01/07/2017  10:10 AM  PATIENT:  Veronica Reynolds  36 y.o. female  PRE-OPERATIVE DIAGNOSIS:  MACROMASTIA  POST-OPERATIVE DIAGNOSIS:  MACROMASTIA  PROCEDURE:  Procedure(s): Bilateral Breast Reductions and Excision of Accessory Breast Tissue (Bilateral)  SURGEON:  Surgeon(s) and Role:    Cristine Polio, MD - Primary  PHYSICIAN ASSISTANT: none  ASSISTANTS: none   ANESTHESIA:   general  EBL:  50 mL   BLOOD ADMINISTERED:none  DRAINS: (right and left axillary areas) Jackson-Pratt drain(s) with closed bulb suction in the right and left axillary areas   LOCAL MEDICATIONS USED:  LIDOCAINE   SPECIMEN:  Excision  DISPOSITION OF SPECIMEN:  PATHOLOGY  COUNTS:  YES  TOURNIQUET:  * No tourniquets in log *  DICTATION: .Other Dictation: Dictation Number [id number]  PLAN OF CARE: Discharge to home after PACU  PATIENT DISPOSITION:  PACU - hemodynamically stable.   Delay start of Pharmacological VTE agent (>24hrs) due to surgical blood loss or risk of bleeding: yes

## 2017-01-07 NOTE — Discharge Instructions (Signed)
Breast Reduction Care After Refer to this sheet in the next few weeks. These instructions provide you with information on caring for yourself after your procedure. Your caregiver may also give you more specific instructions. Your treatment has been planned according to current medical practices, but problems sometimes occur. Call your caregiver if you have any problems or questions after your procedure. HOME CARE INSTRUCTIONS  Do not lift more than 5 pounds with one arm, or 10 pounds with both arms, for 1 month.   Do not sleep on your stomach for 4 to 6 weeks.   Do not do vigorous exercise such as bouncing, aerobics, or jumping for 6 weeks. Walking is not restricted.   Do not drive while you are taking prescription pain medicine.   Avoid prolonged sun exposure.   Keep dressings dry and clean  Measure jp drainage every 12 hrs and measure   You may slowly go back to your normal diet. Start with a light meal and increase as comfortable.   You may shower 24 hours after your drains are removed unless instructed differently by your caregiver.   Take your pain medicine as prescribed. Discomfort is normal after breast reduction surgery.   Keep the head of your bed elevated 40 degrees   : Call the office if you notice:  You have a fever.   You notice drainage from the incision that smells bad.   You have persistent pain.   You have persistent bleeding from the incision or nipple discharge.   You develop increased swelling or swelling that is greater in one breast than in the other.  MAKE SURE YOU:   Understand these instructions.   Will watch your condition.   Will get help right away if you are not doing well or get worse.  Document Released: 08/23/2003 Document Revised: 09/20/2010 Document Reviewed: 04/03/2007 [facility] Patient Information 2012 [place].   About my Jackson-Pratt Bulb Drain  What is a Jackson-Pratt bulb? A Jackson-Pratt is a soft, round device  used to collect drainage. It is connected to a long, thin drainage catheter, which is held in place by one or two small stiches near your surgical incision site. When the bulb is squeezed, it forms a vacuum, forcing the drainage to empty into the bulb.  Emptying the Jackson-Pratt bulb- To empty the bulb: 1. Release the plug on the top of the bulb. 2. Pour the bulb's contents into a measuring container which your nurse will provide. 3. Record the time emptied and amount of drainage. Empty the drain(s) as often as your     doctor or nurse recommends.  Date                  Time                    Amount (Drain 1)                 Amount (Drain 2)  _____________________________________________________________________  _____________________________________________________________________  _____________________________________________________________________  _____________________________________________________________________  _____________________________________________________________________  _____________________________________________________________________  _____________________________________________________________________  _____________________________________________________________________  Squeezing the Jackson-Pratt Bulb- To squeeze the bulb: 1. Make sure the plug at the top of the bulb is open. 2. Squeeze the bulb tightly in your fist. You will hear air squeezing from the bulb. 3. Replace the plug while the bulb is squeezed. 4. Use a safety pin to attach the bulb to your clothing. This will keep the catheter from     pulling at the bulb insertion  site.  When to call your doctor- Call your doctor if:  Drain site becomes red, swollen or hot.  You have a fever greater than 101 degrees F.  There is oozing at the drain site.  Drain falls out (apply a guaze bandage over the drain hole and secure it with tape).  Drainage increases daily not related to activity  patterns. (You will usually have more drainage when you are active than when you are resting.)  Drainage has a bad odor.     Post Anesthesia Home Care Instructions  Activity: Get plenty of rest for the remainder of the day. A responsible individual must stay with you for 24 hours following the procedure.  For the next 24 hours, DO NOT: -Drive a car -Paediatric nurse -Drink alcoholic beverages -Take any medication unless instructed by your physician -Make any legal decisions or sign important papers.  Meals: Start with liquid foods such as gelatin or soup. Progress to regular foods as tolerated. Avoid greasy, spicy, heavy foods. If nausea and/or vomiting occur, drink only clear liquids until the nausea and/or vomiting subsides. Call your physician if vomiting continues.  Special Instructions/Symptoms: Your throat may feel dry or sore from the anesthesia or the breathing tube placed in your throat during surgery. If this causes discomfort, gargle with warm salt water. The discomfort should disappear within 24 hours.  If you had a scopolamine patch placed behind your ear for the management of post- operative nausea and/or vomiting:  1. The medication in the patch is effective for 72 hours, after which it should be removed.  Wrap patch in a tissue and discard in the trash. Wash hands thoroughly with soap and water. 2. You may remove the patch earlier than 72 hours if you experience unpleasant side effects which may include dry mouth, dizziness or visual disturbances. 3. Avoid touching the patch. Wash your hands with soap and water after contact with the patch.

## 2017-01-07 NOTE — Brief Op Note (Signed)
01/07/2017  10:05 AM  PATIENT:  Veronica Reynolds  36 y.o. female  PRE-OPERATIVE DIAGNOSIS:  MACROMASTIA  POST-OPERATIVE DIAGNOSIS:  MACROMASTIA  PROCEDURE:  Procedure(s): Bilateral Breast Reductions and Excision of Accessory Breast Tissue (Bilateral)  SURGEON:  Surgeon(s) and Role:    Cristine Polio, MD - Primary  PHYSICIAN ASSISTANT:   ASSISTANTS: none   ANESTHESIA:   general  EBL:  50 mL   BLOOD ADMINISTERED:none  DRAINS: (dd) Jackson-Pratt drain(s) with closed bulb suction in the 10 fully fluted right and left axillary   LOCAL MEDICATIONS USED:  LIDOCAINE   SPECIMEN:  Excision  DISPOSITION OF SPECIMEN:  PATHOLOGY  COUNTS:  YES  TOURNIQUET:  * No tourniquets in log *  DICTATION: .Other Dictation: Dictation Number [id number]  PLAN OF CARE: Discharge to home after PACU  PATIENT DISPOSITION:  PACU - hemodynamically stable.   Delay start of Pharmacological VTE agent (>24hrs) due to surgical blood loss or risk of bleeding: yes

## 2017-01-07 NOTE — Transfer of Care (Signed)
Immediate Anesthesia Transfer of Care Note  Patient: Veronica Reynolds  Procedure(s) Performed: Bilateral Breast Reductions and Excision of Accessory Breast Tissue (Bilateral Breast)  Patient Location: PACU  Anesthesia Type:General  Level of Consciousness: awake, alert  and oriented  Airway & Oxygen Therapy: Patient Spontanous Breathing and Patient connected to face mask oxygen  Post-op Assessment: Report given to RN and Post -op Vital signs reviewed and stable  Post vital signs: Reviewed and stable  Last Vitals:  Vitals:   01/07/17 0640  BP: 114/81  Pulse: 67  Resp: 17  Temp: 37 C  SpO2: 98%    Last Pain:  Vitals:   01/07/17 0640  TempSrc: Oral         Complications: No apparent anesthesia complications

## 2017-01-08 ENCOUNTER — Encounter (HOSPITAL_BASED_OUTPATIENT_CLINIC_OR_DEPARTMENT_OTHER): Payer: Self-pay | Admitting: Specialist

## 2017-01-08 NOTE — Op Note (Signed)
Veronica Reynolds, Veronica Reynolds               ACCOUNT NO.:  [id number]  MEDICAL RECORD NO.:  [id number]  LOCATION:                                 FACILITY:  PHYSICIAN:  Berneta Sages L. Towanda Reynolds, M.D.   DATE OF BIRTH:  DATE OF PROCEDURE:  01/07/2017 DATE OF DISCHARGE:                              OPERATIVE REPORT   INDICATIONS:  This is a 36 year old lady, who has severe, severe macromastia with back and shoulder pain secondary to large pendulous breasts, pitting.  She has to wear special bras, etc.  PROCEDURES PLANNED:  Bilateral breast reductions using the inferior pedicle technique and excision of the accessory breast tissue bilaterally.  ANESTHESIA:  General.  DESCRIPTION OF SURGERY:  Preoperatively, the patient was sat up and drawn for the reduction mammoplasty, re-marking the nipple-areolar complexes back up to 22 cm from the suprasternal notch.  She was over 30 cm preop.  After this, she underwent drawings, and then general anesthesia and intubated without difficulty.  Prep was done to the chest, breast, and abdominal areas using Hibiclens soap and solution, and walled off with sterile towels and drapes, so as to make a sterile field.  Tumescent was injected into both sides, 500 mL.  The wounds were scored with a #15-blade.  The skin of the inferior pedicle was de-epithelialized with #20-blades.  Medial and lateral fatty dermal pedicles were incised.  A new keyhole area was also debulked bilaterally.  After proper hemostasis, the flaps were transposed and stayed with 3-0 Prolene suture.  Laterally, the accessory breast tissue was removed using liposuction cannula 3s with reduction of the bulge.  Next, the fascia was transposed and stayed with 3-0 Prolene. Subcutaneous closure was done with 3-0 Monocryl x2 layers, and then a running subcuticular stitch of 3-0 Monocryl and 5-0 Monocryl throughout the inverted T.  The wounds were drained with #10 for the [facility] drains, which were placed  in the depths of the wound, and brought out the through the lateral-most portion of the incisions and secured with 3-0 Prolene sutures.  At the end, the nipple-areolar complexes were examined showing good blood supply.  The wounds were cleaned and Steri-Strips and soft dressing were applied to both sides and a proper garment.  CONDITION:  She tolerated the procedures very well.  ESTIMATED BLOOD LOSS:  Less than 100 mL.  COMPLICATIONS:  None.     Veronica Reynolds, M.D.   ______________________________ Veronica Reynolds, M.D.    Elie Confer  D:  01/07/2017  T:  01/08/2017  Job:  [id number]

## 2017-01-26 ENCOUNTER — Other Ambulatory Visit: Payer: Self-pay | Admitting: Medical

## 2017-01-26 DIAGNOSIS — K2900 Acute gastritis without bleeding: Secondary | ICD-10-CM

## 2017-01-30 ENCOUNTER — Encounter: Payer: Self-pay | Admitting: Adult Health

## 2017-02-12 ENCOUNTER — Ambulatory Visit: Payer: BLUE CROSS/BLUE SHIELD | Admitting: Medical

## 2017-02-12 VITALS — BP 111/76 | HR 73 | Temp 99.1°F | Resp 16 | Wt 220.4 lb

## 2017-02-12 DIAGNOSIS — Z760 Encounter for issue of repeat prescription: Secondary | ICD-10-CM

## 2017-02-12 DIAGNOSIS — K219 Gastro-esophageal reflux disease without esophagitis: Secondary | ICD-10-CM

## 2017-02-12 DIAGNOSIS — K5903 Drug induced constipation: Secondary | ICD-10-CM

## 2017-02-12 DIAGNOSIS — K2900 Acute gastritis without bleeding: Secondary | ICD-10-CM

## 2017-02-12 MED ORDER — OMEPRAZOLE 20 MG PO CPDR: 20.0000 mg | DELAYED_RELEASE_CAPSULE | Freq: Every day | ORAL | 1 refills | Status: AC

## 2017-02-12 NOTE — Patient Instructions (Addendum)
Food Choices for Gastroesophageal Reflux Disease, Adult When you have gastroesophageal reflux disease (GERD), the foods you eat and your eating habits are very important. Choosing the right foods can help ease your discomfort. What guidelines do I need to follow?  Choose fruits, vegetables, whole grains, and low-fat dairy products.  Choose low-fat meat, fish, and poultry.  Limit fats such as oils, salad dressings, butter, nuts, and avocado.  Keep a food diary. This helps you identify foods that cause symptoms.  Avoid foods that cause symptoms. These may be different for everyone.  Eat small meals often instead of 3 large meals a day.  Eat your meals slowly, in a place where you are relaxed.  Limit fried foods.  Cook foods using methods other than frying.  Avoid drinking alcohol.  Avoid drinking large amounts of liquids with your meals.  Avoid bending over or lying down until 2-3 hours after eating. What foods are not recommended? These are some foods and drinks that may make your symptoms worse: Vegetables Tomatoes. Tomato juice. Tomato and spaghetti sauce. Chili peppers. Onion and garlic. Horseradish. Fruits Oranges, grapefruit, and lemon (fruit and juice). Meats High-fat meats, fish, and poultry. This includes hot dogs, ribs, ham, sausage, salami, and bacon. Dairy Whole milk and chocolate milk. Sour cream. Cream. Butter. Ice cream. Cream cheese. Drinks Coffee and tea. Bubbly (carbonated) drinks or energy drinks. Condiments Hot sauce. Barbecue sauce. Sweets/Desserts Chocolate and cocoa. Donuts. Peppermint and spearmint. Fats and Oils High-fat foods. This includes [place] fries and potato chips. Other Vinegar. Strong spices. This includes black pepper, white pepper, red pepper, cayenne, curry powder, cloves, ginger, and chili powder. The items listed above may not be a complete list of foods and drinks to avoid. Contact your dietitian for more information. This  information is not intended to replace advice given to you by your health care provider. Make sure you discuss any questions you have with your health care provider. Document Released: 07/10/2011 Document Revised: 06/16/2015 Document Reviewed: 11/12/2012 Elsevier Interactive Patient Education  2017 [employer].  Constipation, Adult Constipation is when a person:  Poops (has a bowel movement) fewer times in a week than normal.  Has a hard time pooping.  Has poop that is dry, hard, or bigger than normal.  Follow these instructions at home: Eating and drinking   Eat foods that have a lot of fiber, such as: ? Fresh fruits and vegetables. ? Whole grains. ? Beans.  Eat less of foods that are high in fat, low in fiber, or overly processed, such as: ? [place] fries. ? Hamburgers. ? Cookies. ? Candy. ? Soda.  Drink enough fluid to keep your pee (urine) clear or pale yellow. General instructions  Exercise regularly or as told by your doctor.  Go to the restroom when you feel like you need to poop. Do not hold it in.  Take over-the-counter and prescription medicines only as told by your doctor. These include any fiber supplements.  Do pelvic floor retraining exercises, such as: ? Doing deep breathing while relaxing your lower belly (abdomen). ? Relaxing your pelvic floor while pooping.  Watch your condition for any changes.  Keep all follow-up visits as told by your doctor. This is important. Contact a doctor if:  You have pain that gets worse.  You have a fever.  You have not pooped for 4 days.  You throw up (vomit).  You are not hungry.  You lose weight.  You are bleeding from the anus.  You have  thin, pencil-like poop (stool). Get help right away if:  You have a fever, and your symptoms suddenly get worse.  You leak poop or have blood in your poop.  Your belly feels hard or bigger than normal (is bloated).  You have very bad belly pain.  You feel dizzy  or you faint. This information is not intended to replace advice given to you by your health care provider. Make sure you discuss any questions you have with your health care provider. Document Released: 06/27/2007 Document Revised: 07/29/2015 Document Reviewed: 06/29/2015 Elsevier Interactive Patient Education  2018 [employer].

## 2017-02-12 NOTE — Progress Notes (Signed)
   Subjective:    [name], female    DOB: 10/02/1980, 37 y.o.   MRN: [id number]  HPI 37 yo non acute distress comes in for  Refill on  Her p\Prilosec medication.. Had her breast reduction in Decemeber, back pain resolved. Feeling well about the surgery. 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   Review of Systems  Constitutional: Negative for chills and fever.  Gastrointestinal: Positive for abdominal pain and constipation.  Musculoskeletal: Negative for myalgias.  Skin: Negative for rash.       Objective:   Physical Exam  Constitutional: She is oriented to person, place, and time. She appears well-developed and well-nourished.  HENT:  Head: Normocephalic and atraumatic.  Eyes: Conjunctivae and EOM are normal. Pupils are equal, round, and reactive to light.  Cardiovascular: Normal rate, regular rhythm and normal heart sounds.  Pulmonary/Chest: Effort normal and breath sounds normal.  Abdominal: Soft. Bowel sounds are normal. She exhibits no distension and no mass. There is no tenderness. There is no rebound and no guarding.  Neurological: She is alert and oriented to person, place, and time.  Skin: Skin is warm and dry.  Psychiatric: She has a normal mood and affect. Her behavior is normal. Judgment and thought content normal.  Nursing note and vitals reviewed.         Assessment & Plan:  Refill medication.\ GERD Hx of constipation with opioid use. Recommended Miralax take as directed , increase water intake. Meds ordered this encounter  Medications  . omeprazole (PRILOSEC) 20 MG capsule    Sig: Take 1 capsule (20 mg total) by mouth daily. Call clinic for office visit follow up within one month    Dispense:  90 capsule    Refill:  1  Return to the clinic as needed.  Acute gastritis without hemorrhage unspecified gastritis type accidentally clicked and associated with prilosec, should of been associated with  the GERD diagnosis. Once associated it would not let me change it.

## 2017-02-15 ENCOUNTER — Encounter: Payer: Self-pay | Admitting: Obstetrics and Gynecology

## 2017-02-21 ENCOUNTER — Other Ambulatory Visit: Payer: Self-pay | Admitting: Obstetrics and Gynecology

## 2017-03-05 ENCOUNTER — Encounter: Payer: Self-pay | Admitting: *Deleted

## 2017-03-13 ENCOUNTER — Ambulatory Visit: Payer: BLUE CROSS/BLUE SHIELD | Admitting: General Surgery

## 2017-03-13 ENCOUNTER — Encounter: Payer: Self-pay | Admitting: General Surgery

## 2017-03-13 VITALS — BP 128/70 | HR 76 | Resp 16 | Ht 67.0 in | Wt 226.0 lb

## 2017-03-13 DIAGNOSIS — D1724 Benign lipomatous neoplasm of skin and subcutaneous tissue of left leg: Secondary | ICD-10-CM | POA: Diagnosis not present

## 2017-03-13 DIAGNOSIS — R2242 Localized swelling, mass and lump, left lower limb: Secondary | ICD-10-CM | POA: Diagnosis not present

## 2017-03-13 NOTE — Progress Notes (Signed)
[name], female   DOB: 06-Oct-1980, 37 y.o.   MRN: [id number]  Chief Complaint  Patient presents with  . Follow-up    HPI Veronica Reynolds is a 37 y.o. female here today for a lipoma on the back pf her leg.She noticed this area about 12 years ago. In the last couple of years the area has got bigger. She states she lost weight and the area started to hurt her when she walks or when she sits for long periods of time.  The patient was amenable to excision. HPI  Past Medical History:  Diagnosis Date  . Anxiety   . Asthma    as child  . Depression   . Exercise-induced asthma 1985   as a child  . Gastritis   . GERD (gastroesophageal reflux disease)   . Migraine    tension - daily, migraines - 3x/mo  . Sleep disturbance     Past Surgical History:  Procedure Laterality Date  . anal fissue repair    . BREAST REDUCTION SURGERY Bilateral 01/07/2017   Procedure: Bilateral Breast Reductions and Excision of Accessory Breast Tissue;  Surgeon: Cristine Polio, MD;  Location: [place];  Service: Plastics;  Laterality: Bilateral;  . CARPAL TUNNEL RELEASE Left 01/25/2015   Procedure: CARPAL TUNNEL RELEASE;  Surgeon: Hessie Knows, MD;  Location: [facility];  Service: Orthopedics;  Laterality: Left;  . COLONOSCOPY WITH PROPOFOL N/A 12/10/2014   Procedure: COLONOSCOPY WITH PROPOFOL;  Surgeon: Lucilla Lame, MD;  Location: [place];  Service: Endoscopy;  Laterality: N/A;  . ESOPHAGOGASTRODUODENOSCOPY (EGD) WITH PROPOFOL N/A 12/10/2014   Procedure: ESOPHAGOGASTRODUODENOSCOPY (EGD) WITH PROPOFOL;  Surgeon: Lucilla Lame, MD;  Location: [place];  Service: Endoscopy;  Laterality: N/A;  GASTRIC BX    Family History  Problem Relation Age of Onset  . Atrial fibrillation Mother   . Alcohol abuse Father   . Arthritis Maternal Grandmother     Social History Social History   Tobacco Use  . Smoking status: Former Smoker    Packs/day: 1.00    Types:  Cigarettes    Last attempt to quit: 07/22/2012    Years since quitting: 4.6  . Smokeless tobacco: Never Used  . Tobacco comment: smoked for 10 years  Substance Use Topics  . Alcohol use: No    Alcohol/week: 0.0 oz    Frequency: Never    Comment: weeksend  . Drug use: Yes    Types: Marijuana    Comment: in the past    Allergies  Allergen Reactions  . Codeine Nausea And Vomiting  . Prozac [Fluoxetine Hcl]     Nightmares, increased urination, decreased appetite, and not feeling like herself.    Current Outpatient Medications  Medication Sig Dispense Refill  . AMETHIA LO 0.1-0.02 & 0.01 MG tablet TK 1 T PO D  4  . cholecalciferol (VITAMIN D) 1000 units tablet Take 4,000 Units by mouth daily.    . clonazePAM (KLONOPIN) 2 MG tablet Take 1 tablet (2 mg total) by mouth at bedtime as needed. 60 tablet 3  . cyanocobalamin (,VITAMIN B-12,) 1000 MCG/ML injection Inject 1 mL (1,000 mcg total) into the muscle every 30 (thirty) days. 10 mL 1  . escitalopram (LEXAPRO) 10 MG tablet Take 1 tablet (10 mg total) by mouth daily. 30 tablet 6  . omeprazole (PRILOSEC) 20 MG capsule Take 1 capsule (20 mg total) by mouth daily. Call clinic for office visit follow up within one month 90 capsule  1   No current facility-administered medications for this visit.     Review of Systems Review of Systems  Blood pressure 128/70, pulse 76, resp. rate 16, height 5\' 7"  (1.702 m), weight 226 lb (102.5 kg).  Physical Exam Physical Exam  Constitutional: She is oriented to person, place, and time. She appears well-developed and well-nourished.  Neurological: She is alert and oriented to person, place, and time.  Skin: Skin is warm and dry.       Data Reviewed The patient was amenable to excision.  10 cc of 0.5% Xylocaine with 0.25% Marcaine with 1-200,000 units of epinephrine was utilized and well-tolerated.  The area was recleansed with ChloraPrep.  A 2.5 cm transversely orientated incision was made over the  mass and a multilobulated lipoma extracted.  The deep tissue was approximated with a 4-0 Vicryl figure-of-eight suture.  The skin was closed with a running 4-0 Vicryl subcuticular suture.  Benzoin, Steri-Strips, Telfa and Tegaderm dressing applied.  Assessment    Enlarging lipoma left posterior thigh.    Plan  Wound care reviewed.  Patient will be contacted when pathology is available. Return as needed. The patient is aware to call back for any questions or concerns.   HPI, Physical Exam, Assessment and Plan have been scribed under the direction and in the presence of Hervey Ard, MD.  Gaspar Cola, CMA  I have completed the exam and reviewed the above documentation for accuracy and completeness.  I agree with the above.  Haematologist has been used and any errors in dictation or transcription are unintentional.  Hervey Ard, M.D., F.A.C.S.  Veronica Reynolds 03/13/2017, 9:02 PM

## 2017-03-13 NOTE — Patient Instructions (Signed)
Return as needed.The patient is aware to call back for any questions or concerns.  

## 2017-05-14 ENCOUNTER — Other Ambulatory Visit: Payer: Self-pay | Admitting: Obstetrics and Gynecology

## 2017-06-06 ENCOUNTER — Ambulatory Visit: Payer: BLUE CROSS/BLUE SHIELD | Admitting: Adult Health

## 2017-06-06 ENCOUNTER — Encounter: Payer: Self-pay | Admitting: Adult Health

## 2017-06-06 VITALS — BP 131/80 | HR 105 | Temp 99.1°F | Resp 16 | Ht 70.0 in | Wt 215.0 lb

## 2017-06-06 DIAGNOSIS — T148XXA Other injury of unspecified body region, initial encounter: Secondary | ICD-10-CM

## 2017-06-06 NOTE — Progress Notes (Addendum)
Subjective:     [name], female   DOB: 25-Nov-1980, 37 y.o.   MRN: [id number]  HPI   Blood pressure 131/80, pulse (!) 105, temperature 99.1 F (37.3 C), resp. rate 16, height 5\' 10"  (1.778 m), weight 215 lb (97.5 kg), SpO2 100 %.   Patient is a 37 year old female in no acute distress who comes to the clinic with complaint of bruising that started around April 15 th left anterior thigh, the next day she had bruising on right thigh. Bilateral upper thighs bruising.  Denies any injury.   Denies any gum bleeding.   Denies any NSAID'S or aspirin. She denied any over the counter medications or supplements.   Not sexual active - denies any chance of pregnancy No LMP recorded. (Menstrual status: Oral contraceptives).  4 months ago - no sexual contact with make partners. Declines pregnancy test.   Denies any rectal or oral bleeding. Denies any unsuaual or heavy bleeding.  She had lipoma removed posterior left thigh - she denies any complication from this surgery with Dr. Bary Castilla..   History of breast reduction in December.   She has been taking Phentermine She sees  Mel[facility] - she is on her last three month period of this and due for weight recheck.   Review of Systems  Constitutional: Positive for fatigue (mild ). Negative for activity change, appetite change, chills, diaphoresis, fever and unexpected weight change.  HENT: Positive for postnasal drip. Negative for congestion, dental problem, drooling, ear discharge, ear pain, facial swelling, hearing loss, mouth sores, nosebleeds, rhinorrhea, sinus pressure, sinus pain, sneezing, sore throat, tinnitus, trouble swallowing and voice change.   Eyes: Negative.   Respiratory: Negative.   Cardiovascular: Negative.   Gastrointestinal: Negative.   Endocrine: Negative.   Musculoskeletal: Negative.   Skin: Negative.   Allergic/Immunologic:        -- Codeine -- Nausea And Vomiting  -- Prozac  (Fluoxetine Hcl)    --  Nightmares, increased urination, decreased            appetite, and not feeling like herself.   Neurological: Negative.   Hematological: Negative for adenopathy. Bruises/bleeds easily (cruises only ).  Psychiatric/Behavioral: Negative.        Objective:   Physical Exam  Constitutional: She is oriented to person, place, and time. She appears well-developed and well-nourished. No distress.  Patient is alert and oriented and responsive to questions Engages in eye contact with provider. Speaks in full sentences without any pauses without any shortness of breath.   Patient moves on and off of exam table and in room without difficulty. Gait is normal in hall and in room. Patient is oriented to person place time and situation. Patient answers questions appropriately and engages in conversation.   HENT:  Head: Normocephalic and atraumatic.  Right Ear: External ear normal.  Left Ear: External ear normal.  Nose: Nose normal.  Mouth/Throat: Oropharynx is clear and moist. No oropharyngeal exudate.  Eyes: Pupils are equal, round, and reactive to light. Conjunctivae and EOM are normal. Right eye exhibits no discharge. Left eye exhibits no discharge. No scleral icterus.  Neck: Normal range of motion. Neck supple. No JVD present. No tracheal deviation present. No thyromegaly present.  Cardiovascular: Normal rate, regular rhythm, normal heart sounds and intact distal pulses. Exam reveals no gallop and no friction rub.  No murmur heard. Pulmonary/Chest: Effort normal and breath sounds normal. No stridor. No respiratory distress. She has no wheezes.  She has no rales. She exhibits no tenderness.  Abdominal: Soft. Bowel sounds are normal.  Musculoskeletal: Normal range of motion.  Lymphadenopathy:    She has no cervical adenopathy.  Neurological: She is alert and oriented to person, place, and time.  Skin: Skin is warm and dry. Capillary refill takes less than 2 seconds.  Ecchymosis noted. No abrasion, no bruising, no burn, no laceration, no lesion, no petechiae and no rash noted. She is not diaphoretic. No cyanosis or erythema. No pallor. Nails show no clubbing.     Mild scattered bruising less than 2 cm in size on right thigh all other areas less than 1 cm in size on areas marked on diagram.  Bruising is tan/ yellow- appears as old healing bruises. No new bruising noted. Full skin check performed.   No purpura or petechiae noted.  Psychiatric: She has a normal mood and affect. Her behavior is normal. Judgment and thought content normal.  Vitals reviewed.      Assessment:     Bruising - Plan: CBC with Differential/Platelet, Comprehensive metabolic panel, INR/PT      Plan:      Lab Orders     CBC with Differential/Platelet     Comprehensive metabolic panel     INR/PT   Advised if any abnormal bleeding proceed to Call 911.  Avoid NSAID'S and ASA products.   Will call with lab results- call if no call within 3 days.   Stop phentermine with mild tachycardia and bruising per Dr. Rosanna Randy  Follow up with prescriber to inform medication has been discontinued,   Advised patient call the office or your primary care doctor for an appointment if no improvement within 72 hours or if any symptoms change or worsen at any time  Advised ER or urgent Care if after hours or on weekend. Call 911 for emergency symptoms at any time.Patinet verbalized understanding of all instructions given/reviewed and treatment plan and has no further questions or concerns at this time.    Patient verbalized understanding of all instructions given and denies any further questions at this time.

## 2017-06-06 NOTE — Patient Instructions (Signed)
Contusion A contusion is a deep bruise. Contusions happen when an injury causes bleeding under the skin. Symptoms of bruising include pain, swelling, and discolored skin. The skin may turn blue, purple, or yellow. Follow these instructions at home:  Rest the injured area.  If told, put ice on the injured area. ? Put ice in a plastic bag. ? Place a towel between your skin and the bag. ? Leave the ice on for 20 minutes, 2-3 times per day.  If told, put light pressure (compression) on the injured area using an elastic bandage. Make sure the bandage is not too tight. Remove it and put it back on as told by your doctor.  If possible, raise (elevate) the injured area above the level of your heart while you are sitting or lying down.  Take over-the-counter and prescription medicines only as told by your doctor. Contact a doctor if:  Your symptoms do not get better after several days of treatment.  Your symptoms get worse.  You have trouble moving the injured area. Get help right away if:  You have very bad pain.  You have a loss of feeling (numbness) in a hand or foot.  Your hand or foot turns pale or cold. This information is not intended to replace advice given to you by your health care provider. Make sure you discuss any questions you have with your health care provider. Document Released: 06/27/2007 Document Revised: 06/16/2015 Document Reviewed: 05/26/2014 Elsevier Interactive Patient Education  2018 [employer]  

## 2017-06-07 ENCOUNTER — Telehealth: Payer: Self-pay | Admitting: Adult Health

## 2017-06-07 ENCOUNTER — Telehealth: Payer: Self-pay

## 2017-06-07 DIAGNOSIS — Z09 Encounter for follow-up examination after completed treatment for conditions other than malignant neoplasm: Secondary | ICD-10-CM

## 2017-06-07 LAB — CBC WITH DIFFERENTIAL/PLATELET
BASOS: 0 %
Basophils Absolute: 0 10*3/uL (ref 0.0–0.2)
EOS (ABSOLUTE): 0.1 10*3/uL (ref 0.0–0.4)
EOS: 2 %
HEMATOCRIT: 43 % (ref 34.0–46.6)
Hemoglobin: 14.3 g/dL (ref 11.1–15.9)
Immature Grans (Abs): 0 10*3/uL (ref 0.0–0.1)
Immature Granulocytes: 0 %
LYMPHS ABS: 2.1 10*3/uL (ref 0.7–3.1)
Lymphs: 29 %
MCH: 30 pg (ref 26.6–33.0)
MCHC: 33.3 g/dL (ref 31.5–35.7)
MCV: 90 fL (ref 79–97)
Monocytes Absolute: 0.4 10*3/uL (ref 0.1–0.9)
Monocytes: 5 %
Neutrophils Absolute: 4.7 10*3/uL (ref 1.4–7.0)
Neutrophils: 64 %
PLATELETS: 216 10*3/uL (ref 150–379)
RBC: 4.76 x10E6/uL (ref 3.77–5.28)
RDW: 13.5 % (ref 12.3–15.4)
WBC: 7.4 10*3/uL (ref 3.4–10.8)

## 2017-06-07 LAB — COMPREHENSIVE METABOLIC PANEL
A/G RATIO: 1.7 (ref 1.2–2.2)
ALT: 23 IU/L (ref 0–32)
AST: 16 IU/L (ref 0–40)
Albumin: 4.5 g/dL (ref 3.5–5.5)
Alkaline Phosphatase: 45 IU/L (ref 39–117)
BUN/Creatinine Ratio: 15 (ref 9–23)
BUN: 15 mg/dL (ref 6–20)
Bilirubin Total: 0.5 mg/dL (ref 0.0–1.2)
CALCIUM: 9.3 mg/dL (ref 8.7–10.2)
CO2: 18 mmol/L — ABNORMAL LOW (ref 20–29)
CREATININE: 1.03 mg/dL — AB (ref 0.57–1.00)
Chloride: 103 mmol/L (ref 96–106)
GFR, EST AFRICAN AMERICAN: 81 mL/min/{1.73_m2} (ref >59)
GFR, EST NON AFRICAN AMERICAN: 70 mL/min/{1.73_m2} (ref >59)
Globulin, Total: 2.7 g/dL (ref 1.5–4.5)
Glucose: 119 mg/dL — ABNORMAL HIGH (ref 65–99)
Potassium: 3.9 mmol/L (ref 3.5–5.2)
Sodium: 138 mmol/L (ref 134–144)
TOTAL PROTEIN: 7.2 g/dL (ref 6.0–8.5)

## 2017-06-07 LAB — PROTIME-INR
INR: 1 (ref 0.8–1.2)
Prothrombin Time: 10.4 s (ref 9.1–12.0)

## 2017-06-07 LAB — SPECIMEN STATUS REPORT

## 2017-06-07 LAB — APTT: aPTT: 30 s (ref 24–33)

## 2017-06-07 NOTE — Telephone Encounter (Signed)
Spoke with Judeen Hammans at [facility].  They will add on a PTT to previous lab draw.

## 2017-06-07 NOTE — Telephone Encounter (Signed)
A user error has taken place.

## 2017-06-07 NOTE — Telephone Encounter (Addendum)
06/07/17 1:13 am - Called patient to review lab results. Creatinine mildly elevated - advised to hydrate, glucose was elevated however she is not fasting had just eaten a large meal.   Will recheck labs in 8 weeks fasting. Avoid NSAID'S and Aspirin.  Recheck labs in 8 weeks. Patient is aware to call and schedule. Keep one week follow up appointment and call if symptoms worsen. Keep plan at office visit . She has discontinued Adipex.  PTT is pending  Orders Placed This Encounter  Procedures  . CMP12+LP+TP+TSH+6AC+CBC/D/Plt    Standing Status:   Future    Standing Expiration Date:   12/08/2017    Order Specific Question:   Has the patient fasted?    Answer:   Yes  . VITAMIN D 25 Hydroxy (Vit-D Deficiency, Fractures)    Standing Status:   Future    Standing Expiration Date:   12/08/2017    Patient verbalized understanding of all instructions given and denies any further questions at this time.    Recent Results (from the past 2160 hour(s))  CBC with Differential/Platelet     Status: None   Collection Time: 06/06/17  3:34 PM  Result Value Ref Range   WBC 7.4 3.4 - 10.8 x10E3/uL   RBC 4.76 3.77 - 5.28 x10E6/uL   Hemoglobin 14.3 11.1 - 15.9 g/dL   Hematocrit 43.0 34.0 - 46.6 %   MCV 90 79 - 97 fL   MCH 30.0 26.6 - 33.0 pg   MCHC 33.3 31.5 - 35.7 g/dL   RDW 13.5 12.3 - 15.4 %   Platelets 216 150 - 379 x10E3/uL   Neutrophils 64 Not Estab. %   Lymphs 29 Not Estab. %   Monocytes 5 Not Estab. %   Eos 2 Not Estab. %   Basos 0 Not Estab. %   Neutrophils Absolute 4.7 1.4 - 7.0 x10E3/uL   Lymphocytes Absolute 2.1 0.7 - 3.1 x10E3/uL   Monocytes Absolute 0.4 0.1 - 0.9 x10E3/uL   EOS (ABSOLUTE) 0.1 0.0 - 0.4 x10E3/uL   Basophils Absolute 0.0 0.0 - 0.2 x10E3/uL   Immature Granulocytes 0 Not Estab. %   Immature Grans (Abs) 0.0 0.0 - 0.1 x10E3/uL  Comprehensive metabolic panel     Status: Abnormal   Collection Time: 06/06/17  3:34 PM  Result Value Ref Range   Glucose 119 (H) 65 - 99 mg/dL    BUN 15 6 - 20 mg/dL   Creatinine, Ser 1.03 (H) 0.57 - 1.00 mg/dL   GFR calc non Af Amer 70 >59 mL/min/1.73   GFR calc Af Amer 81 >59 mL/min/1.73   BUN/Creatinine Ratio 15 9 - 23   Sodium 138 134 - 144 mmol/L   Potassium 3.9 3.5 - 5.2 mmol/L   Chloride 103 96 - 106 mmol/L   CO2 18 (L) 20 - 29 mmol/L   Calcium 9.3 8.7 - 10.2 mg/dL   Total Protein 7.2 6.0 - 8.5 g/dL   Albumin 4.5 3.5 - 5.5 g/dL   Globulin, Total 2.7 1.5 - 4.5 g/dL   Albumin/Globulin Ratio 1.7 1.2 - 2.2   Bilirubin Total 0.5 0.0 - 1.2 mg/dL   Alkaline Phosphatase 45 39 - 117 IU/L   AST 16 0 - 40 IU/L   ALT 23 0 - 32 IU/L  INR/PT     Status: None   Collection Time: 06/06/17  3:34 PM  Result Value Ref Range   INR 1.0 0.8 - 1.2    Comment: Reference interval is for non-anticoagulated patients.  Suggested INR therapeutic range for Vitamin K antagonist therapy:    Standard Dose (moderate intensity                   therapeutic range):       2.0 - 3.0    Higher intensity therapeutic range       2.5 - 3.5    Prothrombin Time 10.4 9.1 - 12.0 sec  Specimen status report     Status: None (Preliminary result)   Collection Time: 06/06/17  3:34 PM  Result Value Ref Range   specimen status report Comment     Comment: [facility]

## 2017-06-10 ENCOUNTER — Encounter (INDEPENDENT_AMBULATORY_CARE_PROVIDER_SITE_OTHER): Payer: Self-pay

## 2017-06-10 ENCOUNTER — Other Ambulatory Visit: Payer: Self-pay | Admitting: Obstetrics and Gynecology

## 2017-06-10 NOTE — Progress Notes (Signed)
Normal APTT - result released to my chart - keep current plan as discussed in office

## 2017-06-13 ENCOUNTER — Ambulatory Visit: Payer: BLUE CROSS/BLUE SHIELD | Admitting: Adult Health

## 2017-07-22 ENCOUNTER — Other Ambulatory Visit: Payer: Self-pay | Admitting: Obstetrics and Gynecology

## 2017-09-24 ENCOUNTER — Other Ambulatory Visit: Payer: Self-pay | Admitting: Obstetrics and Gynecology

## 2017-10-30 ENCOUNTER — Ambulatory Visit: Payer: BLUE CROSS/BLUE SHIELD | Admitting: Medical

## 2017-11-30 ENCOUNTER — Other Ambulatory Visit: Payer: Self-pay | Admitting: Obstetrics and Gynecology

## 2017-12-25 NOTE — Telephone Encounter (Signed)
Patient declined  

## 2018-01-01 ENCOUNTER — Telehealth: Payer: BLUE CROSS/BLUE SHIELD | Admitting: Nurse Practitioner

## 2018-01-01 DIAGNOSIS — K529 Noninfective gastroenteritis and colitis, unspecified: Secondary | ICD-10-CM

## 2018-01-01 MED ORDER — ONDANSETRON HCL 4 MG PO TABS: 4.0000 mg | ORAL_TABLET | Freq: Three times a day (TID) | ORAL | 0 refills | Status: AC | PRN

## 2018-01-01 MED ORDER — CIPROFLOXACIN HCL 500 MG PO TABS: 500.0000 mg | ORAL_TABLET | Freq: Two times a day (BID) | ORAL | 0 refills | Status: AC

## 2018-01-01 NOTE — Progress Notes (Signed)
We are sorry that you are not feeling well.  Here is how we plan to help!  Based on what you have shared with me it looks like you have Acute Infectious Diarrhea.  Most cases of acute diarrhea are due to infections with virus and bacteria and are self-limited conditions lasting less than 14 days.  For your symptoms you may take Imodium 2 mg tablets that are over the counter at your local pharmacy. Take two tablet now and then one after each loose stool up to 6 a day.  Antibiotics are not needed for most people with diarrhea.  Optional: Zofran 4 mg 1 tablet every 8 hours as needed for nausea and vomiting  Optional: I have sent in Cipro 500 mg two tablets twice a day for five days or azithromycin 500 mg daily for 3 days.  HOME CARE  We recommend changing your diet to help with your symptoms for the next few days.  Drink plenty of fluids that contain water salt and sugar. Sports drinks such as Gatorade may help.   You may try broths, soups, bananas, applesauce, soft breads, mashed potatoes or crackers.   You are considered infectious for as long as the diarrhea continues. Hand washing or use of alcohol based hand sanitizers is recommend.  It is best to stay out of work or school until your symptoms stop.   GET HELP RIGHT AWAY  If you have dark yellow colored urine or do not pass urine frequently you should drink more fluids.    If your symptoms worsen   If you feel like you are going to pass out (faint)  You have a new problem  MAKE SURE YOU   Understand these instructions.  Will watch your condition.  Will get help right away if you are not doing well or get worse.  Your e-visit answers were reviewed by a board certified advanced clinical practitioner to complete your personal care plan.  Depending on the condition, your plan could have included both over the counter or prescription medications.  If there is a problem please reply  once you have received a response from  your provider.  Your safety is important to [place].  If you have drug allergies check your prescription carefully.    You can use MyChart to ask questions about today's visit, request a non-urgent call back, or ask for a work or school excuse for 24 hours related to this e-Visit. If it has been greater than 24 hours you will need to follow up with your provider, or enter a new e-Visit to address those concerns.   You will get an e-mail in the next two days asking about your experience.  I hope that your e-visit has been valuable and will speed your recovery. Thank you for using e-visits.

## 2018-01-03 ENCOUNTER — Ambulatory Visit: Payer: BLUE CROSS/BLUE SHIELD | Admitting: Adult Health

## 2018-01-03 ENCOUNTER — Encounter: Payer: Self-pay | Admitting: Adult Health

## 2018-01-03 DIAGNOSIS — A084 Viral intestinal infection, unspecified: Secondary | ICD-10-CM

## 2018-01-03 DIAGNOSIS — N912 Amenorrhea, unspecified: Secondary | ICD-10-CM

## 2018-01-03 LAB — POCT URINE PREGNANCY: Preg Test, Ur: NEGATIVE

## 2018-01-03 NOTE — Patient Instructions (Signed)
Bland Diet A bland diet consists of foods that do not have a lot of fat or fiber. Foods without fat or fiber are easier for the body to digest. They are also less likely to irritate your mouth, throat, stomach, and other parts of your gastrointestinal tract. A bland diet is sometimes called a BRAT diet. What is my plan? Your health care provider or dietitian may recommend specific changes to your diet to prevent and treat your symptoms, such as:  Eating small meals often.  Cooking food until it is soft enough to chew easily.  Chewing your food well.  Drinking fluids slowly.  Not eating foods that are very spicy, sour, or fatty.  Not eating citrus fruits, such as oranges and grapefruit.  What do I need to know about this diet?  Eat a variety of foods from the bland diet food list.  Do not follow a bland diet longer than you have to.  Ask your health care provider whether you should take vitamins. What foods can I eat? Grains  Hot cereals, such as cream of wheat. Bread, crackers, or tortillas made from refined white flour. Rice. Vegetables Canned or cooked vegetables. Mashed or boiled potatoes. Fruits Bananas. Applesauce. Other types of cooked or canned fruit with the skin and seeds removed, such as canned peaches or pears. Meats and Other Protein Sources Scrambled eggs. Creamy peanut butter or other nut butters. Lean, well-cooked meats, such as chicken or fish. Tofu. Soups or broths. Dairy Low-fat dairy products, such as milk, cottage cheese, or yogurt. Beverages Water. Herbal tea. Apple juice. Sweets and Desserts Pudding. Custard. Fruit gelatin. Ice cream. Fats and Oils Mild salad dressings. Canola or olive oil. The items listed above may not be a complete list of allowed foods or beverages. Contact your dietitian for more options. What foods are not recommended? Foods and ingredients that are often not recommended include:  Spicy foods, such as hot sauce or  salsa.  Fried foods.  Sour foods, such as pickled or fermented foods.  Raw vegetables or fruits, especially citrus or berries.  Caffeinated drinks.  Alcohol.  Strongly flavored seasonings or condiments.  The items listed above may not be a complete list of foods and beverages that are not allowed. Contact your dietitian for more information. This information is not intended to replace advice given to you by your health care provider. Make sure you discuss any questions you have with your health care provider. Document Released: 05/02/2015 Document Revised: 06/16/2015 Document Reviewed: 01/20/2014 Elsevier Interactive Patient Education  2018 [employer]. Viral Gastroenteritis, Adult Viral gastroenteritis is also known as the stomach flu. This condition is caused by certain germs (viruses). These germs can be passed from person to person very easily (are very contagious). This condition can cause sudden watery poop (diarrhea), fever, and throwing up (vomiting). Having watery poop and throwing up can make you feel weak and cause you to get dehydrated. Dehydration can make you tired and thirsty, make you have a dry mouth, and make it so you pee (urinate) less often. Older adults and people with other diseases or a weak defense system (immune system) are at higher risk for dehydration. It is important to replace the fluids that you lose from having watery poop and throwing up. Follow these instructions at home: Follow instructions from your doctor about how to care for yourself at home. Eating and drinking  Follow these instructions as told by your doctor:  Take an oral rehydration solution (ORS). This is  a drink that is sold at pharmacies and stores.  Drink clear fluids in small amounts as you are able, such as: ? Water. ? Ice chips. ? Diluted fruit juice. ? Low-calorie sports drinks.  Eat bland, easy-to-digest foods in small amounts as you are able, such  as: ? Bananas. ? Applesauce. ? Rice. ? Low-fat (lean) meats. ? Toast. ? Crackers.  Avoid fluids that have a lot of sugar or caffeine in them.  Avoid alcohol.  Avoid spicy or fatty foods.  General instructions  Drink enough fluid to keep your pee (urine) clear or pale yellow.  Wash your hands often. If you cannot use soap and water, use hand sanitizer.  Make sure that all people in your home wash their hands well and often.  Rest at home while you get better.  Take over-the-counter and prescription medicines only as told by your doctor.  Watch your condition for any changes.  Take a warm bath to help with any burning or pain from having watery poop.  Keep all follow-up visits as told by your doctor. This is important. Contact a doctor if:  You cannot keep fluids down.  Your symptoms get worse.  You have new symptoms.  You feel light-headed or dizzy.  You have muscle cramps. Get help right away if:  You have chest pain.  You feel very weak or you pass out (faint).  You see blood in your throw-up.  Your throw-up looks like coffee grounds.  You have bloody or black poop (stools) or poop that look like tar.  You have a very bad headache, a stiff neck, or both.  You have a rash.  You have very bad pain, cramping, or bloating in your belly (abdomen).  You have trouble breathing.  You are breathing very quickly.  Your heart is beating very quickly.  Your skin feels cold and clammy.  You feel confused.  You have pain when you pee.  You have signs of dehydration, such as: ? Dark pee, hardly any pee, or no pee. ? Cracked lips. ? Dry mouth. ? Sunken eyes. ? Sleepiness. ? Weakness. This information is not intended to replace advice given to you by your health care provider. Make sure you discuss any questions you have with your health care provider. Document Released: 06/27/2007 Document Revised: 07/29/2015 Document Reviewed: 09/14/2014 Elsevier  Interactive Patient Education  2017 [employer].

## 2018-01-03 NOTE — Progress Notes (Signed)
Subjective:     [name], female   DOB: 1980-06-27, 38 y.o.   MRN: [id number]  HPI   Blood pressure (!) 154/80, pulse 100, temperature 98.5 F (36.9 C), resp. rate 16, height 5\' 9"  (1.753 m), weight 233 lb (105.7 kg), SpO2 98 %.  Patient is a 37 year old female in no acute distress who comes to the clinic for history of diarrhea and vomiting.  She reports vomiting and diarrhea both resolved on 01/02/2018  And denies any symptoms currently.  She reports her daughter had the same symptoms on last Thursday and Friday. She was out of work with daughter those days.   She started with diarrhea on 12/8 with diarrhea and 12/11 with vomiting.   01/01/18 she did an e visit for diarrhea and vomiting and was given Cipro. She stopped the Cipro  She was told by e- visit staff today to stop taking the Cipro.   She was able to eat banana. She is able to drink.  She reports her birth control was switched by gynecology and denies a period for 6 months. She was told she would have cycle once every three months. She has stopped birth control in past one month.    She was wanting to get FMLA paperwork for She does not have a primary care. She see gynecology regular.   She had endoscopy and colonoscopy and has history of gastritis. Denies any rectal bleeding. Denies any oral bleeding.   Allergies  Allergen Reactions  . Codeine Nausea And Vomiting  . Prozac [Fluoxetine Hcl]     Nightmares, increased urination, decreased appetite, and not feeling like herself.    Review of Systems  Constitutional: Positive for appetite change (decreased ). Negative for activity change, chills, diaphoresis, fatigue, fever and unexpected weight change.  HENT: Negative.   Respiratory: Negative.   Cardiovascular: Negative.   Gastrointestinal: Positive for diarrhea and vomiting. Negative for abdominal distention, abdominal pain, anal bleeding, blood in stool, constipation, nausea and rectal pain.    Stopped all on 01/02/18  Genitourinary: Negative.   Musculoskeletal: Negative.   Skin: Negative.   Allergic/Immunologic: Negative.   Neurological: Negative.   Hematological: Negative.   Psychiatric/Behavioral: Negative.        Objective:   Physical Exam Constitutional:      General: She is not in acute distress.    Appearance: Normal appearance. She is not ill-appearing, toxic-appearing or diaphoretic.  HENT:     Head: Normocephalic and atraumatic.     Nose: Nose normal.     Mouth/Throat:     Mouth: Mucous membranes are moist.  Eyes:     Extraocular Movements: Extraocular movements intact.     Pupils: Pupils are equal, round, and reactive to light.  Neck:     Musculoskeletal: Normal range of motion and neck supple. No neck rigidity or muscular tenderness.     Vascular: No carotid bruit.  Cardiovascular:     Rate and Rhythm: Normal rate and regular rhythm.     Pulses: Normal pulses.     Heart sounds: Normal heart sounds. No murmur. No friction rub. No gallop.   Pulmonary:     Effort: Pulmonary effort is normal. No respiratory distress.     Breath sounds: Normal breath sounds. No stridor. No wheezing, rhonchi or rales.  Chest:     Chest wall: No tenderness.  Abdominal:     General: There is no distension.     Palpations: Abdomen is soft. There is  no mass.     Tenderness: There is no abdominal tenderness. There is no right CVA tenderness, left CVA tenderness, guarding or rebound.     Hernia: No hernia is present.     Comments: Mildly hyperactive bowel sounds all quadrants.   Musculoskeletal: Normal range of motion.  Lymphadenopathy:     Cervical: No cervical adenopathy.  Skin:    General: Skin is warm and dry.     Capillary Refill: Capillary refill takes less than 2 seconds.  Neurological:     General: No focal deficit present.     Mental Status: She is alert and oriented to person, place, and time.  Psychiatric:        Mood and Affect: Mood normal.        Behavior:  Behavior normal.        Thought Content: Thought content normal.        Judgment: Judgment normal.        Assessment:     Viral gastroenteritis - Plan: Comprehensive metabolic panel, CBC  Amenorrhea - Plan: POCT urine pregnancy     Plan:    Work note given.   Follow up for amenorhea with your gynecologist in next 1- 2 weeks.   FMLA papers not filled out.  Monitor blood pressure keep log. Be seen in consistently 140/90.  Orders Placed This Encounter  Procedures  . Comprehensive metabolic panel  . CBC  . POCT urine pregnancy   Results for orders placed or performed in visit on 01/03/18 (from the past 24 hour(s))  POCT urine pregnancy     Status: Normal   Collection Time: 01/03/18  1:21 PM  Result Value Ref Range   Preg Test, Ur Negative Negative    She is aware she needs to find a primary care provider.  Provider also recommends patient see primary care physician for a routine physical and to establish primary care. Patient may chose provider of choice. Also gave the [place] at [phone] or web site at [email or web address] to help assist with finding a primary care doctor. Patient understands this office is acute care and no longer taking new primary care patients.    Advised patient call the office or your primary care doctor for an appointment if no improvement within 72 hours or if any symptoms change or worsen at any time  Advised ER or urgent Care if after hours or on weekend. Call 911 for emergency symptoms at any time.Patinet verbalized understanding of all instructions given/reviewed and treatment plan and has no further questions or concerns at this time.    Patient verbalized understanding of all instructions given and denies any further questions at this time.

## 2018-01-04 LAB — CBC
Hematocrit: 42 % (ref 34.0–46.6)
Hemoglobin: 14.8 g/dL (ref 11.1–15.9)
MCH: 30.7 pg (ref 26.6–33.0)
MCHC: 35.2 g/dL (ref 31.5–35.7)
MCV: 87 fL (ref 79–97)
Platelets: 158 10*3/uL (ref 150–450)
RBC: 4.82 x10E6/uL (ref 3.77–5.28)
RDW: 12.6 % (ref 12.3–15.4)
WBC: 7 10*3/uL (ref 3.4–10.8)

## 2018-01-04 LAB — COMPREHENSIVE METABOLIC PANEL
ALT: 32 IU/L (ref 0–32)
AST: 21 IU/L (ref 0–40)
Albumin/Globulin Ratio: 1.7 (ref 1.2–2.2)
Albumin: 4.3 g/dL (ref 3.5–5.5)
Alkaline Phosphatase: 47 IU/L (ref 39–117)
BUN/Creatinine Ratio: 16 (ref 9–23)
BUN: 16 mg/dL (ref 6–20)
Bilirubin Total: 0.4 mg/dL (ref 0.0–1.2)
CHLORIDE: 101 mmol/L (ref 96–106)
CO2: 21 mmol/L (ref 20–29)
Calcium: 9.1 mg/dL (ref 8.7–10.2)
Creatinine, Ser: 0.98 mg/dL (ref 0.57–1.00)
GFR calc Af Amer: 86 mL/min/{1.73_m2} (ref >59)
GFR calc non Af Amer: 74 mL/min/{1.73_m2} (ref >59)
Globulin, Total: 2.5 g/dL (ref 1.5–4.5)
Glucose: 101 mg/dL — ABNORMAL HIGH (ref 65–99)
Potassium: 4 mmol/L (ref 3.5–5.2)
Sodium: 137 mmol/L (ref 134–144)
Total Protein: 6.8 g/dL (ref 6.0–8.5)

## 2018-02-04 ENCOUNTER — Other Ambulatory Visit: Payer: Self-pay | Admitting: Obstetrics and Gynecology

## 2018-05-12 ENCOUNTER — Other Ambulatory Visit: Payer: Self-pay | Admitting: Obstetrics and Gynecology

## 2018-07-06 ENCOUNTER — Other Ambulatory Visit: Payer: Self-pay | Admitting: Obstetrics and Gynecology
